# Patient Record
Sex: Male | Born: 1970 | Race: White | Hispanic: No | Marital: Married | Smoking: Current some day smoker
Health system: Southern US, Community
[De-identification: ages and names within clinical notes are randomized; demographics above are authoritative.]

## PROBLEM LIST (undated history)

## (undated) DIAGNOSIS — Z9889 Other specified postprocedural states: Secondary | ICD-10-CM

## (undated) DIAGNOSIS — Z87442 Personal history of urinary calculi: Secondary | ICD-10-CM

## (undated) DIAGNOSIS — T8859XA Other complications of anesthesia, initial encounter: Secondary | ICD-10-CM

## (undated) DIAGNOSIS — F419 Anxiety disorder, unspecified: Secondary | ICD-10-CM

## (undated) DIAGNOSIS — E119 Type 2 diabetes mellitus without complications: Secondary | ICD-10-CM

## (undated) DIAGNOSIS — T7840XA Allergy, unspecified, initial encounter: Secondary | ICD-10-CM

## (undated) DIAGNOSIS — R51 Headache: Secondary | ICD-10-CM

## (undated) DIAGNOSIS — I10 Essential (primary) hypertension: Secondary | ICD-10-CM

## (undated) HISTORY — DX: Allergy, unspecified, initial encounter: T78.40XA

## (undated) HISTORY — DX: Headache: R51

## (undated) HISTORY — PX: OTHER SURGICAL HISTORY: SHX169

## (undated) HISTORY — PX: KNEE ARTHROSCOPY: SUR90

## (undated) HISTORY — DX: Anxiety disorder, unspecified: F41.9

## (undated) HISTORY — DX: Essential (primary) hypertension: I10

---

## 2000-11-19 ENCOUNTER — Encounter: Admission: RE | Admit: 2000-11-19 | Discharge: 2000-11-19 | Payer: Self-pay | Admitting: Family Medicine

## 2001-03-26 ENCOUNTER — Encounter: Admission: RE | Admit: 2001-03-26 | Discharge: 2001-03-26 | Payer: Self-pay | Admitting: Family Medicine

## 2001-10-21 ENCOUNTER — Encounter: Admission: RE | Admit: 2001-10-21 | Discharge: 2001-10-21 | Payer: Self-pay | Admitting: Family Medicine

## 2001-11-09 ENCOUNTER — Encounter: Admission: RE | Admit: 2001-11-09 | Discharge: 2001-11-09 | Payer: Self-pay | Admitting: Family Medicine

## 2002-01-13 ENCOUNTER — Encounter: Payer: Self-pay | Admitting: Family Medicine

## 2002-01-13 ENCOUNTER — Encounter: Admission: RE | Admit: 2002-01-13 | Discharge: 2002-01-13 | Payer: Self-pay | Admitting: Family Medicine

## 2002-01-25 ENCOUNTER — Encounter: Admission: RE | Admit: 2002-01-25 | Discharge: 2002-01-25 | Payer: Self-pay | Admitting: General Practice

## 2002-01-25 ENCOUNTER — Encounter: Payer: Self-pay | Admitting: General Practice

## 2004-10-25 ENCOUNTER — Ambulatory Visit: Payer: Self-pay | Admitting: Family Medicine

## 2004-12-06 ENCOUNTER — Ambulatory Visit: Payer: Self-pay | Admitting: Family Medicine

## 2005-09-02 ENCOUNTER — Ambulatory Visit: Payer: Self-pay | Admitting: Family Medicine

## 2006-02-16 ENCOUNTER — Ambulatory Visit: Payer: Self-pay | Admitting: Family Medicine

## 2006-04-04 ENCOUNTER — Ambulatory Visit: Payer: Self-pay | Admitting: Family Medicine

## 2006-08-03 ENCOUNTER — Ambulatory Visit: Payer: Self-pay | Admitting: Family Medicine

## 2006-08-14 ENCOUNTER — Ambulatory Visit: Payer: Self-pay | Admitting: Family Medicine

## 2006-10-21 ENCOUNTER — Ambulatory Visit: Payer: Self-pay | Admitting: Family Medicine

## 2007-01-04 ENCOUNTER — Ambulatory Visit: Payer: Self-pay | Admitting: Family Medicine

## 2007-02-01 ENCOUNTER — Ambulatory Visit: Payer: Self-pay | Admitting: Family Medicine

## 2007-09-30 ENCOUNTER — Encounter: Payer: Self-pay | Admitting: Family Medicine

## 2007-10-04 ENCOUNTER — Ambulatory Visit: Payer: Self-pay | Admitting: Family Medicine

## 2007-10-04 DIAGNOSIS — J019 Acute sinusitis, unspecified: Secondary | ICD-10-CM | POA: Insufficient documentation

## 2007-10-12 ENCOUNTER — Ambulatory Visit: Payer: Self-pay | Admitting: Family Medicine

## 2007-10-12 DIAGNOSIS — B9789 Other viral agents as the cause of diseases classified elsewhere: Secondary | ICD-10-CM

## 2007-10-14 ENCOUNTER — Ambulatory Visit: Payer: Self-pay | Admitting: Family Medicine

## 2007-10-14 DIAGNOSIS — R51 Headache: Secondary | ICD-10-CM | POA: Insufficient documentation

## 2007-10-14 DIAGNOSIS — J309 Allergic rhinitis, unspecified: Secondary | ICD-10-CM | POA: Insufficient documentation

## 2007-10-14 DIAGNOSIS — I10 Essential (primary) hypertension: Secondary | ICD-10-CM | POA: Insufficient documentation

## 2007-10-14 DIAGNOSIS — F411 Generalized anxiety disorder: Secondary | ICD-10-CM

## 2007-10-14 DIAGNOSIS — F419 Anxiety disorder, unspecified: Secondary | ICD-10-CM | POA: Insufficient documentation

## 2007-10-14 DIAGNOSIS — R519 Headache, unspecified: Secondary | ICD-10-CM | POA: Insufficient documentation

## 2007-10-14 LAB — CONVERTED CEMR LAB
ALT: 39 units/L (ref 0–53)
AST: 31 units/L (ref 0–37)
Albumin: 3.9 g/dL (ref 3.5–5.2)
Alkaline Phosphatase: 49 units/L (ref 39–117)
BUN: 11 mg/dL (ref 6–23)
Basophils Absolute: 0.1 10*3/uL (ref 0.0–0.1)
Basophils Relative: 0.8 % (ref 0.0–1.0)
Bilirubin, Direct: 0.1 mg/dL (ref 0.0–0.3)
CO2: 29 meq/L (ref 19–32)
Calcium: 9.2 mg/dL (ref 8.4–10.5)
Chloride: 105 meq/L (ref 96–112)
Creatinine, Ser: 1.1 mg/dL (ref 0.4–1.5)
Eosinophils Absolute: 0.2 10*3/uL (ref 0.0–0.6)
Eosinophils Relative: 2.6 % (ref 0.0–5.0)
GFR calc Af Amer: 99 mL/min
GFR calc non Af Amer: 81 mL/min
Glucose, Bld: 153 mg/dL — ABNORMAL HIGH (ref 70–99)
HCT: 42.5 % (ref 39.0–52.0)
Hemoglobin: 14.5 g/dL (ref 13.0–17.0)
Lymphocytes Relative: 13.7 % (ref 12.0–46.0)
MCHC: 34 g/dL (ref 30.0–36.0)
MCV: 88.7 fL (ref 78.0–100.0)
Mono Screen: NEGATIVE
Monocytes Absolute: 1 10*3/uL — ABNORMAL HIGH (ref 0.2–0.7)
Monocytes Relative: 12.3 % — ABNORMAL HIGH (ref 3.0–11.0)
Neutro Abs: 5.5 10*3/uL (ref 1.4–7.7)
Neutrophils Relative %: 70.6 % (ref 43.0–77.0)
Platelets: 262 10*3/uL (ref 150–400)
Potassium: 3.8 meq/L (ref 3.5–5.1)
RBC: 4.79 M/uL (ref 4.22–5.81)
RDW: 11.7 % (ref 11.5–14.6)
Sodium: 140 meq/L (ref 135–145)
TSH: 1.68 microintl units/mL (ref 0.35–5.50)
Total Bilirubin: 0.7 mg/dL (ref 0.3–1.2)
Total Protein: 6.7 g/dL (ref 6.0–8.3)
WBC: 7.9 10*3/uL (ref 4.5–10.5)

## 2007-11-03 ENCOUNTER — Ambulatory Visit: Payer: Self-pay | Admitting: Family Medicine

## 2007-11-05 LAB — CONVERTED CEMR LAB: Hgb A1c MFr Bld: 5.6 % (ref 4.6–6.0)

## 2007-11-10 ENCOUNTER — Ambulatory Visit: Payer: Self-pay | Admitting: Family Medicine

## 2008-01-04 ENCOUNTER — Ambulatory Visit: Payer: Self-pay | Admitting: Family Medicine

## 2008-01-04 LAB — CONVERTED CEMR LAB
Bilirubin Urine: NEGATIVE
Blood in Urine, dipstick: NEGATIVE
Glucose, Urine, Semiquant: NEGATIVE
Ketones, urine, test strip: NEGATIVE
Nitrite: NEGATIVE
Specific Gravity, Urine: 1.025
Urobilinogen, UA: 0.2
WBC Urine, dipstick: NEGATIVE
pH: 6.5

## 2008-01-06 LAB — CONVERTED CEMR LAB
ALT: 34 units/L (ref 0–53)
AST: 38 units/L — ABNORMAL HIGH (ref 0–37)
Albumin: 4.3 g/dL (ref 3.5–5.2)
Alkaline Phosphatase: 40 units/L (ref 39–117)
BUN: 18 mg/dL (ref 6–23)
Basophils Absolute: 0 10*3/uL (ref 0.0–0.1)
Basophils Relative: 0.1 % (ref 0.0–1.0)
Bilirubin, Direct: 0.2 mg/dL (ref 0.0–0.3)
CO2: 31 meq/L (ref 19–32)
Calcium: 9.8 mg/dL (ref 8.4–10.5)
Chloride: 101 meq/L (ref 96–112)
Cholesterol: 180 mg/dL (ref 0–200)
Creatinine, Ser: 1.2 mg/dL (ref 0.4–1.5)
Eosinophils Absolute: 0.2 10*3/uL (ref 0.0–0.6)
Eosinophils Relative: 2.6 % (ref 0.0–5.0)
GFR calc Af Amer: 89 mL/min
GFR calc non Af Amer: 74 mL/min
Glucose, Bld: 95 mg/dL (ref 70–99)
HCT: 47.7 % (ref 39.0–52.0)
HDL: 30.3 mg/dL — ABNORMAL LOW (ref >39.0)
Hemoglobin: 15.8 g/dL (ref 13.0–17.0)
LDL Cholesterol: 125 mg/dL — ABNORMAL HIGH (ref 0–99)
Lymphocytes Relative: 31.2 % (ref 12.0–46.0)
MCHC: 33.2 g/dL (ref 30.0–36.0)
MCV: 88.5 fL (ref 78.0–100.0)
Monocytes Absolute: 1.1 10*3/uL — ABNORMAL HIGH (ref 0.2–0.7)
Monocytes Relative: 15.6 % — ABNORMAL HIGH (ref 3.0–11.0)
Neutro Abs: 3.4 10*3/uL (ref 1.4–7.7)
Neutrophils Relative %: 50.5 % (ref 43.0–77.0)
Platelets: 257 10*3/uL (ref 150–400)
Potassium: 4.2 meq/L (ref 3.5–5.1)
RBC: 5.39 M/uL (ref 4.22–5.81)
RDW: 13.2 % (ref 11.5–14.6)
Sodium: 139 meq/L (ref 135–145)
TSH: 1.9 microintl units/mL (ref 0.35–5.50)
Total Bilirubin: 1 mg/dL (ref 0.3–1.2)
Total CHOL/HDL Ratio: 5.9
Total Protein: 7.1 g/dL (ref 6.0–8.3)
Triglycerides: 122 mg/dL (ref 0–149)
VLDL: 24 mg/dL (ref 0–40)
WBC: 6.9 10*3/uL (ref 4.5–10.5)

## 2008-02-09 ENCOUNTER — Ambulatory Visit: Payer: Self-pay | Admitting: Family Medicine

## 2008-05-12 ENCOUNTER — Telehealth: Payer: Self-pay | Admitting: Family Medicine

## 2008-05-19 ENCOUNTER — Ambulatory Visit: Payer: Self-pay | Admitting: Family Medicine

## 2008-12-04 ENCOUNTER — Ambulatory Visit: Payer: Self-pay | Admitting: Family Medicine

## 2008-12-04 DIAGNOSIS — K589 Irritable bowel syndrome without diarrhea: Secondary | ICD-10-CM | POA: Insufficient documentation

## 2008-12-14 ENCOUNTER — Ambulatory Visit: Payer: Self-pay | Admitting: Family Medicine

## 2008-12-18 ENCOUNTER — Telehealth: Payer: Self-pay | Admitting: Family Medicine

## 2009-02-07 ENCOUNTER — Ambulatory Visit: Payer: Self-pay | Admitting: Family Medicine

## 2009-02-07 DIAGNOSIS — M6740 Ganglion, unspecified site: Secondary | ICD-10-CM | POA: Insufficient documentation

## 2009-02-13 ENCOUNTER — Telehealth: Payer: Self-pay | Admitting: Family Medicine

## 2009-03-02 ENCOUNTER — Ambulatory Visit: Payer: Self-pay | Admitting: Family Medicine

## 2009-03-02 LAB — CONVERTED CEMR LAB
ALT: 40 units/L (ref 0–53)
AST: 34 units/L (ref 0–37)
Albumin: 4.2 g/dL (ref 3.5–5.2)
Alkaline Phosphatase: 50 units/L (ref 39–117)
BUN: 15 mg/dL (ref 6–23)
Basophils Absolute: 0 10*3/uL (ref 0.0–0.1)
Basophils Relative: 0.4 % (ref 0.0–3.0)
Bilirubin Urine: NEGATIVE
Bilirubin, Direct: 0.1 mg/dL (ref 0.0–0.3)
CO2: 31 meq/L (ref 19–32)
Calcium: 9.4 mg/dL (ref 8.4–10.5)
Chloride: 104 meq/L (ref 96–112)
Cholesterol: 188 mg/dL (ref 0–200)
Creatinine, Ser: 1.2 mg/dL (ref 0.4–1.5)
Eosinophils Absolute: 0.2 10*3/uL (ref 0.0–0.7)
Eosinophils Relative: 2.9 % (ref 0.0–5.0)
GFR calc non Af Amer: 72.76 mL/min (ref >60)
Glucose, Bld: 104 mg/dL — ABNORMAL HIGH (ref 70–99)
HCT: 45.4 % (ref 39.0–52.0)
HDL: 34.4 mg/dL — ABNORMAL LOW (ref >39.00)
Hemoglobin, Urine: NEGATIVE
Hemoglobin: 16.1 g/dL (ref 13.0–17.0)
Ketones, ur: NEGATIVE mg/dL
LDL Cholesterol: 132 mg/dL — ABNORMAL HIGH (ref 0–99)
Leukocytes, UA: NEGATIVE
Lymphocytes Relative: 29 % (ref 12.0–46.0)
Lymphs Abs: 1.9 10*3/uL (ref 0.7–4.0)
MCHC: 35.5 g/dL (ref 30.0–36.0)
MCV: 88.6 fL (ref 78.0–100.0)
Monocytes Absolute: 0.7 10*3/uL (ref 0.1–1.0)
Monocytes Relative: 10.8 % (ref 3.0–12.0)
Neutro Abs: 3.7 10*3/uL (ref 1.4–7.7)
Neutrophils Relative %: 56.9 % (ref 43.0–77.0)
Nitrite: NEGATIVE
Platelets: 251 10*3/uL (ref 150.0–400.0)
Potassium: 4.1 meq/L (ref 3.5–5.1)
RBC: 5.12 M/uL (ref 4.22–5.81)
RDW: 12 % (ref 11.5–14.6)
Sodium: 141 meq/L (ref 135–145)
Specific Gravity, Urine: 1.02 (ref 1.000–1.030)
TSH: 2.28 microintl units/mL (ref 0.35–5.50)
Total Bilirubin: 0.8 mg/dL (ref 0.3–1.2)
Total CHOL/HDL Ratio: 5
Total Protein, Urine: NEGATIVE mg/dL
Total Protein: 7.2 g/dL (ref 6.0–8.3)
Triglycerides: 108 mg/dL (ref 0.0–149.0)
Urine Glucose: NEGATIVE mg/dL
Urobilinogen, UA: 0.2 (ref 0.0–1.0)
VLDL: 21.6 mg/dL (ref 0.0–40.0)
WBC: 6.5 10*3/uL (ref 4.5–10.5)
pH: 6.5 (ref 5.0–8.0)

## 2009-03-07 ENCOUNTER — Ambulatory Visit: Payer: Self-pay | Admitting: Family Medicine

## 2009-05-22 ENCOUNTER — Ambulatory Visit: Payer: Self-pay | Admitting: Family Medicine

## 2009-06-15 ENCOUNTER — Telehealth: Payer: Self-pay | Admitting: Family Medicine

## 2009-09-24 ENCOUNTER — Ambulatory Visit: Payer: Self-pay | Admitting: Family Medicine

## 2009-09-28 ENCOUNTER — Telehealth: Payer: Self-pay | Admitting: Family Medicine

## 2009-12-03 ENCOUNTER — Telehealth: Payer: Self-pay | Admitting: Family Medicine

## 2010-03-12 ENCOUNTER — Ambulatory Visit: Payer: Self-pay | Admitting: Family Medicine

## 2010-03-12 DIAGNOSIS — M7710 Lateral epicondylitis, unspecified elbow: Secondary | ICD-10-CM | POA: Insufficient documentation

## 2010-04-19 ENCOUNTER — Ambulatory Visit: Payer: Self-pay | Admitting: Family Medicine

## 2010-04-25 ENCOUNTER — Ambulatory Visit: Payer: Self-pay | Admitting: Family Medicine

## 2010-04-29 LAB — CONVERTED CEMR LAB
ALT: 32 units/L (ref 0–53)
AST: 26 units/L (ref 0–37)
Albumin: 4.5 g/dL (ref 3.5–5.2)
Alkaline Phosphatase: 53 units/L (ref 39–117)
BUN: 14 mg/dL (ref 6–23)
Basophils Absolute: 0.1 10*3/uL (ref 0.0–0.1)
Basophils Relative: 0.7 % (ref 0.0–3.0)
Bilirubin Urine: NEGATIVE
Bilirubin, Direct: 0.1 mg/dL (ref 0.0–0.3)
CO2: 32 meq/L (ref 19–32)
Calcium: 9.8 mg/dL (ref 8.4–10.5)
Chloride: 102 meq/L (ref 96–112)
Cholesterol: 173 mg/dL (ref 0–200)
Creatinine, Ser: 1.2 mg/dL (ref 0.4–1.5)
Direct LDL: 106.6 mg/dL
Eosinophils Absolute: 0.2 10*3/uL (ref 0.0–0.7)
Eosinophils Relative: 2.7 % (ref 0.0–5.0)
GFR calc non Af Amer: 73.72 mL/min (ref >60)
Glucose, Bld: 82 mg/dL (ref 70–99)
HCT: 47.2 % (ref 39.0–52.0)
HDL: 37.1 mg/dL — ABNORMAL LOW (ref >39.00)
Hemoglobin, Urine: NEGATIVE
Hemoglobin: 16.5 g/dL (ref 13.0–17.0)
Ketones, ur: NEGATIVE mg/dL
Leukocytes, UA: NEGATIVE
Lymphocytes Relative: 25.6 % (ref 12.0–46.0)
Lymphs Abs: 2 10*3/uL (ref 0.7–4.0)
MCHC: 35 g/dL (ref 30.0–36.0)
MCV: 88.3 fL (ref 78.0–100.0)
Monocytes Absolute: 0.9 10*3/uL (ref 0.1–1.0)
Monocytes Relative: 10.7 % (ref 3.0–12.0)
Neutro Abs: 4.8 10*3/uL (ref 1.4–7.7)
Neutrophils Relative %: 60.3 % (ref 43.0–77.0)
Nitrite: NEGATIVE
Platelets: 306 10*3/uL (ref 150.0–400.0)
Potassium: 4.6 meq/L (ref 3.5–5.1)
RBC: 5.35 M/uL (ref 4.22–5.81)
RDW: 12.6 % (ref 11.5–14.6)
Sodium: 139 meq/L (ref 135–145)
Specific Gravity, Urine: 1.01 (ref 1.000–1.030)
TSH: 2.1 microintl units/mL (ref 0.35–5.50)
Total Bilirubin: 0.6 mg/dL (ref 0.3–1.2)
Total CHOL/HDL Ratio: 5
Total Protein, Urine: NEGATIVE mg/dL
Total Protein: 7.2 g/dL (ref 6.0–8.3)
Triglycerides: 228 mg/dL — ABNORMAL HIGH (ref 0.0–149.0)
Urine Glucose: NEGATIVE mg/dL
Urobilinogen, UA: 0.2 (ref 0.0–1.0)
VLDL: 45.6 mg/dL — ABNORMAL HIGH (ref 0.0–40.0)
WBC: 8 10*3/uL (ref 4.5–10.5)
pH: 7 (ref 5.0–8.0)

## 2010-04-30 ENCOUNTER — Ambulatory Visit: Payer: Self-pay | Admitting: Family Medicine

## 2010-10-07 ENCOUNTER — Telehealth: Payer: Self-pay | Admitting: Family Medicine

## 2011-01-07 NOTE — Assessment & Plan Note (Signed)
Summary: sinuses//ccm   Vital Signs:  Patient profile:   40 year old male Weight:      203 pounds Temp:     98.2 degrees F oral Pulse rate:   90 / minute Pulse rhythm:   regular BP sitting:   136 / 98  (left arm) Cuff size:   regular  Vitals Entered By: Duard Brady LPN (March 12, 1609 3:28 PM) CC: c/o sinus infection - started prednisone friday - stopped it today Is Patient Diabetic? No   History of Present Illness: Here for one week of sinus pain and pressure, PND, ST, and a dry cough. No fever. He took Prednisone for 3 days with no improvement. He also mentions some intermittent pains in the right elbow which started a few weeks ago. No trauma he knows of, but of course he plays a lot of tennis and works a s a Careers information officer.   Preventive Screening-Counseling & Management  Alcohol-Tobacco     Smoking Status: never  Allergies (verified): No Known Drug Allergies  Past History:  Past Medical History: Reviewed history from 10/14/2007 and no changes required. Allergic rhinitis Anxiety Headache Hypertension  Review of Systems  The patient denies anorexia, fever, weight loss, weight gain, vision loss, decreased hearing, hoarseness, chest pain, syncope, dyspnea on exertion, peripheral edema, hemoptysis, abdominal pain, melena, hematochezia, severe indigestion/heartburn, hematuria, incontinence, genital sores, muscle weakness, suspicious skin lesions, transient blindness, difficulty walking, depression, unusual weight change, abnormal bleeding, enlarged lymph nodes, angioedema, breast masses, and testicular masses.    Physical Exam  General:  Well-developed,well-nourished,in no acute distress; alert,appropriate and cooperative throughout examination Head:  Normocephalic and atraumatic without obvious abnormalities. No apparent alopecia or balding. Eyes:  No corneal or conjunctival inflammation noted. EOMI. Perrla. Funduscopic exam benign, without hemorrhages, exudates  or papilledema. Vision grossly normal. Ears:  External ear exam shows no significant lesions or deformities.  Otoscopic examination reveals clear canals, tympanic membranes are intact bilaterally without bulging, retraction, inflammation or discharge. Hearing is grossly normal bilaterally. Nose:  External nasal examination shows no deformity or inflammation. Nasal mucosa are pink and moist without lesions or exudates. Mouth:  Oral mucosa and oropharynx without lesions or exudates.  Teeth in good repair. Neck:  No deformities, masses, or tenderness noted. Lungs:  Normal respiratory effort, chest expands symmetrically. Lungs are clear to auscultation, no crackles or wheezes. Extremities:  tender over the right lateral epicondyle, full ROM   Impression & Recommendations:  Problem # 1:  SINUSITIS, ACUTE NOS (ICD-461.9)  His updated medication list for this problem includes:    Zyrtec-d Allergy & Congestion 5-120 Mg Tb12 (Cetirizine-pseudoephedrine) ..... Once daily    Flonase 50 Mcg/act Susp (Fluticasone propionate) .Marland Kitchen... 2 sprays each nostril once daily    Avelox 400 Mg Tabs (Moxifloxacin hcl) ..... Once daily  Problem # 2:  LATERAL EPICONDYLITIS (ICD-726.32)  Complete Medication List: 1)  Zyrtec-d Allergy & Congestion 5-120 Mg Tb12 (Cetirizine-pseudoephedrine) .... Once daily 2)  Multivitamins Tabs (Multiple vitamin) .... Once daily 3)  Prednisone 10 Mg Tabs (Prednisone) .... As directed 4)  Exforge 10-320 Mg Tabs (Amlodipine besylate-valsartan) .Marland Kitchen.. 1 by mouth once daily 5)  Flonase 50 Mcg/act Susp (Fluticasone propionate) .... 2 sprays each nostril once daily 6)  Avelox 400 Mg Tabs (Moxifloxacin hcl) .... Once daily  Patient Instructions: 1)  rest the arm, use a support strap, ice packs, Motrin as needed . 2)  Please schedule a follow-up appointment as needed .  Prescriptions: AVELOX 400 MG TABS (MOXIFLOXACIN  HCL) once daily  #10 x 0   Entered and Authorized by:   Nelwyn Salisbury MD    Signed by:   Nelwyn Salisbury MD on 03/12/2010   Method used:   Electronically to        [facility] (retail)       [address]       [place], [place]  [address]       Ph: [phone] or [phone]       Fax: [phone]   RxID:   [id number]

## 2011-01-07 NOTE — Progress Notes (Signed)
Summary: zoloft refill & increased strength  Phone Note Call from Patient Call back at Home Phone [phone]   Caller: vm Summary of Call: Needs refill Zoloft 50mg , but requests 100mg  strength.  Took some of wife's 100mg & that is better for him.  Please give 26mo of the 100mg .   HT [facility] [facility]    Initial call taken by: Rudy Jew, RN,  October 07, 2010 9:06 AM  Follow-up for Phone Call        increase Zoloft to 100 mg once daily . Call in 6  month suply Follow-up by: Nelwyn Salisbury MD,  October 07, 2010 11:32 AM  Additional Follow-up for Phone Call Additional follow up Details #1::        done  pt notified.  Additional Follow-up by: Pura Spice, RN,  October 07, 2010 11:40 AM    New/Updated Medications: ZOLOFT 100 MG TABS (SERTRALINE HCL) 1 by mouth once daily Prescriptions: ZOLOFT 100 MG TABS (SERTRALINE HCL) 1 by mouth once daily  #30 x 6   Entered by:   Pura Spice, RN   Authorized by:   Nelwyn Salisbury MD   Signed by:   Pura Spice, RN on 10/07/2010   Method used:   Electronically to        [facility] (retail)       [address]       [place], [place]  [address]       Ph: [phone] or [phone]       Fax: [phone]   RxID:   [id number]

## 2011-01-07 NOTE — Assessment & Plan Note (Signed)
Summary: cpx/njr   Vital Signs:  Patient profile:   40 year old male Weight:      196 pounds BMI:     29.05 BP sitting:   166 / 86  (left arm) Cuff size:   regular  Vitals Entered By: Raechel Ache, RN (Apr 30, 2010 10:09 AM) CC: CPX, labs done.   History of Present Illness: 40 yr old male for a cpx. He feels fine physically, and emotionally he feels much better. He has been taking Zoloft for the past 2 weeks, and he feels more relaxed and more in control of his emotions. No problems to report.   Allergies (verified): No Known Drug Allergies  Past History:  Past Medical History: Reviewed history from 10/14/2007 and no changes required. Allergic rhinitis Anxiety Headache Hypertension  Past Surgical History: Reviewed history from 10/14/2007 and no changes required. wisdom teeth extraction right knee arthroscopy  Family History: Reviewed history from 10/14/2007 and no changes required. Family History Diabetes 1st degree relative Family History High cholesterol Family History Hypertension Family History of Alcoholism/Addiction Family History Depression  Social History: Reviewed history from 10/14/2007 and no changes required. Married Never Smoked Alcohol use-yes  Review of Systems  The patient denies anorexia, fever, weight loss, weight gain, vision loss, decreased hearing, hoarseness, chest pain, syncope, dyspnea on exertion, peripheral edema, prolonged cough, headaches, hemoptysis, abdominal pain, melena, hematochezia, severe indigestion/heartburn, hematuria, incontinence, genital sores, muscle weakness, suspicious skin lesions, transient blindness, difficulty walking, depression, unusual weight change, abnormal bleeding, enlarged lymph nodes, angioedema, breast masses, and testicular masses.    Physical Exam  General:  Well-developed,well-nourished,in no acute distress; alert,appropriate and cooperative throughout examination Head:  Normocephalic and atraumatic  without obvious abnormalities. No apparent alopecia or balding. Eyes:  No corneal or conjunctival inflammation noted. EOMI. Perrla. Funduscopic exam benign, without hemorrhages, exudates or papilledema. Vision grossly normal. Ears:  External ear exam shows no significant lesions or deformities.  Otoscopic examination reveals clear canals, tympanic membranes are intact bilaterally without bulging, retraction, inflammation or discharge. Hearing is grossly normal bilaterally. Nose:  External nasal examination shows no deformity or inflammation. Nasal mucosa are pink and moist without lesions or exudates. Mouth:  Oral mucosa and oropharynx without lesions or exudates.  Teeth in good repair. Neck:  No deformities, masses, or tenderness noted. Chest Wall:  No deformities, masses, tenderness or gynecomastia noted. Lungs:  Normal respiratory effort, chest expands symmetrically. Lungs are clear to auscultation, no crackles or wheezes. Heart:  Normal rate and regular rhythm. S1 and S2 normal without gallop, murmur, click, rub or other extra sounds. Abdomen:  Bowel sounds positive,abdomen soft and non-tender without masses, organomegaly or hernias noted. Genitalia:  Testes bilaterally descended without nodularity, tenderness or masses. No scrotal masses or lesions. No penis lesions or urethral discharge. Msk:  No deformity or scoliosis noted of thoracic or lumbar spine.   Pulses:  R and L carotid,radial,femoral,dorsalis pedis and posterior tibial pulses are full and equal bilaterally Extremities:  No clubbing, cyanosis, edema, or deformity noted with normal full range of motion of all joints.   Neurologic:  No cranial nerve deficits noted. Station and gait are normal. Plantar reflexes are down-going bilaterally. DTRs are symmetrical throughout. Sensory, motor and coordinative functions appear intact. Skin:  Intact without suspicious lesions or rashes Cervical Nodes:  No lymphadenopathy noted Axillary Nodes:   No palpable lymphadenopathy Inguinal Nodes:  No significant adenopathy Psych:  Cognition and judgment appear intact. Alert and cooperative with normal attention span and concentration. No  apparent delusions, illusions, hallucinations   Impression & Recommendations:  Problem # 1:  PHYSICAL EXAMINATION (ICD-V70.0)  Complete Medication List: 1)  Zyrtec-d Allergy & Congestion 5-120 Mg Tb12 (Cetirizine-pseudoephedrine) .... Once daily 2)  Exforge 10-320 Mg Tabs (Amlodipine besylate-valsartan) .Marland Kitchen.. 1 by mouth once daily 3)  Flonase 50 Mcg/act Susp (Fluticasone propionate) .... 2 sprays each nostril once daily 4)  Zoloft 50 Mg Tabs (Sertraline hcl) .... Once daily  Patient Instructions: 1)  Please schedule a follow-up appointment in 3 months .

## 2011-01-07 NOTE — Assessment & Plan Note (Signed)
Summary: ANXIETY CONCERNS / STRESS // RS   Vital Signs:  Patient profile:   40 year old male Weight:      196 pounds Temp:     97.6 degrees F oral Pulse rate:   104 / minute Resp:     12 per minute BP sitting:   150 / 96  Vitals Entered By: Darren Calhoun CMA (Apr 19, 2010 9:42 AM) CC: anxiety issues Is Patient Diabetic? No Pain Assessment Patient in pain? no        History of Present Illness: Here asking for help with anxiety. He and his wife have been having marital problems, and they are now seriously thinking about separating. They still get along fairly well, but they are not sure they want to stay married. This is very upsetting to Darren Calhoun, and he has trouble relaxing, esting ,sleeping, etc.   Current Medications (verified): 1)  Zyrtec-D Allergy & Congestion 5-120 Mg  Tb12 (Cetirizine-Pseudoephedrine) .... Once Daily 2)  Exforge 10-320 Mg Tabs (Amlodipine Besylate-Valsartan) .Marland Kitchen.. 1 By Mouth Once Daily 3)  Flonase 50 Mcg/act Susp (Fluticasone Propionate) .... 2 Sprays Each Nostril Once Daily  Allergies (verified): No Known Drug Allergies  Past History:  Past Medical History: Reviewed history from 10/14/2007 and no changes required. Allergic rhinitis Anxiety Headache Hypertension  Review of Systems  The patient denies anorexia, fever, weight loss, weight gain, vision loss, decreased hearing, hoarseness, chest pain, syncope, dyspnea on exertion, peripheral edema, prolonged cough, headaches, hemoptysis, abdominal pain, melena, hematochezia, severe indigestion/heartburn, hematuria, incontinence, genital sores, muscle weakness, suspicious skin lesions, transient blindness, difficulty walking, unusual weight change, abnormal bleeding, enlarged lymph nodes, angioedema, breast masses, and testicular masses.    Physical Exam  General:  Well-developed,well-nourished,in no acute distress; alert,appropriate and cooperative throughout examination Psych:  Oriented X3, memory  intact for recent and remote, normally interactive, good eye contact, and slightly anxious.     Impression & Recommendations:  Problem # 1:  ANXIETY (ICD-300.00)  His updated medication list for this problem includes:    Zoloft 50 Mg Tabs (Sertraline hcl) ..... Once daily  Complete Medication List: 1)  Zyrtec-d Allergy & Congestion 5-120 Mg Tb12 (Cetirizine-pseudoephedrine) .... Once daily 2)  Exforge 10-320 Mg Tabs (Amlodipine besylate-valsartan) .Marland Kitchen.. 1 by mouth once daily 3)  Flonase 50 Mcg/act Susp (Fluticasone propionate) .... 2 sprays each nostril once daily 4)  Zoloft 50 Mg Tabs (Sertraline hcl) .... Once daily  Patient Instructions: 1)  We discussed this for 30 minutes and agreed to try medication. start Zoloft.  2)  Please schedule a follow-up appointment in 2 weeks.  Prescriptions: ZOLOFT 50 MG TABS (SERTRALINE HCL) once daily  #30 x 5   Entered and Authorized by:   Nelwyn Salisbury MD   Signed by:   Nelwyn Salisbury MD on 04/19/2010   Method used:   Electronically to        [facility] (retail)       [address]       [place], [place]  [address]       Ph: [phone] or [phone]       Fax: [phone]   RxID:   [id number]

## 2011-02-16 ENCOUNTER — Other Ambulatory Visit: Payer: Self-pay | Admitting: Family Medicine

## 2011-02-18 ENCOUNTER — Other Ambulatory Visit: Payer: Self-pay | Admitting: Family Medicine

## 2011-04-15 ENCOUNTER — Other Ambulatory Visit: Payer: Self-pay | Admitting: Family Medicine

## 2011-04-22 NOTE — Letter (Signed)
October 14, 2007     RE:  SARGENT, MANKEY  MRN:  [id number]  /  DOB:  01-Dec-1971   To Whom It May Concern:   This letter is concerning a patient of mine by the name of Darren Calhoun (date of birth [birth year]).  I have been serving as this  patient's primary care physician since I met him in 2003.  He has a long  history of upper respiratory allergies among other things.  Over the  last several years, he has had a lot more trouble with these allergies  than he ever has, and in fact, has developed frequent upper respiratory  infections and sinus infections, which are directly related to flare ups  of his allergies.  I have treated him with numerous courses of  antibiotics, and apparently he has been out of work frequently due to a  physical inability to perform his job.  He informs me that his work  environment includes working in older buildings, which contain a lot of  fungal elements, mildew, and dust.  I am quite certain that these  greatly exacerbate his allergy symptoms, and also play a role in his  frequent sinus infections.  I believe he is currently attempting to move  out of this work environment in to newer and cleaner facilities.  This  letter will serve as my medical recommendation that he do this as  quickly as possible.  I do believe his current work environment  adversely affects his health.   If I may be of further assistance, please let me know.    Sincerely,      Tera Mater. Clent Ridges, MD  Electronically Signed    SAF/MedQ  DD: 10/14/2007  DT: 10/14/2007  Job #: [id number]

## 2011-04-25 NOTE — Assessment & Plan Note (Signed)
[facility] OFFICE NOTE   Calhoun, Darren                     MRN:          [id number]  DATE:08/14/2006                            DOB:          [birth year]    This is a 40 year old gentleman here for complete physical examination.  Generally, he feels good and has no complaints.  He does need refills on his  allergy medications, however, he continues to be quite active with playing a  lot of tennis.  We have been treating him also for hyperlipidemia and  hypertension.  For details of his past medical history, family history,  social history, habits, etc, refer to our last physical note dated December 12, 2003.   ALLERGIES:  None.   CURRENT MEDICATIONS:  1. Diovan HCT 160/25, once a day.  2. Zyrtec 10 mg per day.  3. Rhinocort Aqua sprays daily as needed.   OBJECTIVE:  VITAL SIGNS:  Height 5 feet, 10 inches.  Weight 193.  BP 140/100  (at home he is consistently in the 120s systolic and 70s diastolic).  Pulsating regular.  GENERAL:  He appears to be healthy.  SKIN:  Free of any lesions.  EYES:  Clear.  EARS:  Clear.  PHARYNX:  Clear.  NECK:  Supple without lymphadenopathy or masses.  LUNGS:  Clear.  CARDIAC:  Regular rate and rhythm regular without gallops, murmurs, or rubs.  Distal pulses full.  ABDOMEN:  Soft, normal bowel sounds, nontender.  No masses.  GENITALIA:  Normal male.  EXTREMITIES:  No clubbing, cyanosis, or edema.  NEUROLOGIC:  Grossly intact.   He was here for fasting labs on August 27.  These were remarkable only for a  mildly abnormal lipid panel.  His LDL was slightly high at 127.  HDL  slightly low at 36.   ASSESSMENT AND PLAN:  1. Complete physical examination.  We will get this again in a year.  2. Hyperlipidemia.  Advised him to watch his diet a little more strictly.  3. Hypertension, probably stable.  4. Allergies, stable.                                   Tera Mater. Clent Ridges, MD   SAF/MedQ  DD:  08/17/2006  DT:  08/17/2006  Job #:  [id number]

## 2011-06-22 ENCOUNTER — Other Ambulatory Visit: Payer: Self-pay | Admitting: Family Medicine

## 2011-09-21 ENCOUNTER — Other Ambulatory Visit: Payer: Self-pay | Admitting: Family Medicine

## 2011-10-23 ENCOUNTER — Other Ambulatory Visit: Payer: Self-pay | Admitting: Family Medicine

## 2011-11-23 ENCOUNTER — Other Ambulatory Visit: Payer: Self-pay | Admitting: Family Medicine

## 2011-12-18 ENCOUNTER — Other Ambulatory Visit: Payer: Self-pay | Admitting: Family Medicine

## 2011-12-22 ENCOUNTER — Telehealth: Payer: Self-pay | Admitting: Family Medicine

## 2011-12-22 MED ORDER — AMLODIPINE BESYLATE-VALSARTAN 10-320 MG PO TABS: 1.0000 | ORAL_TABLET | Freq: Every day | ORAL | Status: DC

## 2011-12-22 NOTE — Telephone Encounter (Signed)
He needs one month of BP refills until his cpx

## 2011-12-27 ENCOUNTER — Other Ambulatory Visit: Payer: Self-pay | Admitting: Family Medicine

## 2011-12-29 NOTE — Telephone Encounter (Signed)
Okay for one year  

## 2012-01-18 ENCOUNTER — Other Ambulatory Visit: Payer: Self-pay | Admitting: Family Medicine

## 2012-01-23 ENCOUNTER — Other Ambulatory Visit (INDEPENDENT_AMBULATORY_CARE_PROVIDER_SITE_OTHER): Payer: BC Managed Care – PPO

## 2012-01-23 DIAGNOSIS — Z0000 Encounter for general adult medical examination without abnormal findings: Secondary | ICD-10-CM

## 2012-01-23 LAB — CBC WITH DIFFERENTIAL/PLATELET
Eosinophils Relative: 3.2 % (ref 0.0–5.0)
HCT: 45.5 % (ref 39.0–52.0)
Hemoglobin: 15.6 g/dL (ref 13.0–17.0)
Lymphs Abs: 2.2 10*3/uL (ref 0.7–4.0)
Monocytes Relative: 11.1 % (ref 3.0–12.0)
Platelets: 266 10*3/uL (ref 150.0–400.0)
WBC: 7.6 10*3/uL (ref 4.5–10.5)

## 2012-01-23 LAB — POCT URINALYSIS DIPSTICK
Glucose, UA: NEGATIVE
Leukocytes, UA: NEGATIVE
Nitrite, UA: NEGATIVE
Urobilinogen, UA: 0.2

## 2012-01-23 LAB — LIPID PANEL
Cholesterol: 192 mg/dL (ref 0–200)
Total CHOL/HDL Ratio: 5

## 2012-01-23 LAB — BASIC METABOLIC PANEL
BUN: 16 mg/dL (ref 6–23)
GFR: 71.63 mL/min (ref >60.00)
Potassium: 4.2 mEq/L (ref 3.5–5.1)
Sodium: 138 mEq/L (ref 135–145)

## 2012-01-23 LAB — LDL CHOLESTEROL, DIRECT: Direct LDL: 94.5 mg/dL

## 2012-01-23 LAB — HEPATIC FUNCTION PANEL
ALT: 53 U/L (ref 0–53)
AST: 31 U/L (ref 0–37)
Bilirubin, Direct: 0 mg/dL (ref 0.0–0.3)
Total Bilirubin: 0.5 mg/dL (ref 0.3–1.2)

## 2012-01-23 LAB — TSH: TSH: 2.73 u[IU]/mL (ref 0.35–5.50)

## 2012-01-27 ENCOUNTER — Encounter: Payer: Self-pay | Admitting: Family Medicine

## 2012-01-27 NOTE — Progress Notes (Signed)
Quick Note:  Tried to reach pt by phone, no answer, put a copy of results in mail. ______

## 2012-01-28 ENCOUNTER — Ambulatory Visit (INDEPENDENT_AMBULATORY_CARE_PROVIDER_SITE_OTHER): Payer: BC Managed Care – PPO | Admitting: Family Medicine

## 2012-01-28 ENCOUNTER — Encounter: Payer: Self-pay | Admitting: Family Medicine

## 2012-01-28 VITALS — BP 124/78 | HR 107 | Temp 98.3°F | Ht 69.0 in | Wt 220.0 lb

## 2012-01-28 DIAGNOSIS — Z0000 Encounter for general adult medical examination without abnormal findings: Secondary | ICD-10-CM

## 2012-01-28 NOTE — Progress Notes (Signed)
  Subjective:    Patient ID: Darren Calhoun, male    DOB: [birth year], 41 y.o.   MRN: [id number]  HPI 41 yr old male for a cpx. He feels fine and has no complaints.    Review of Systems  Constitutional: Negative.   HENT: Negative.   Eyes: Negative.   Respiratory: Negative.   Cardiovascular: Negative.   Gastrointestinal: Negative.   Genitourinary: Negative.   Musculoskeletal: Negative.   Skin: Negative.   Neurological: Negative.   Hematological: Negative.   Psychiatric/Behavioral: Negative.        Objective:   Physical Exam  Constitutional: He is oriented to person, place, and time. He appears well-developed and well-nourished. No distress.  HENT:  Head: Normocephalic and atraumatic.  Right Ear: External ear normal.  Left Ear: External ear normal.  Nose: Nose normal.  Mouth/Throat: Oropharynx is clear and moist. No oropharyngeal exudate.  Eyes: Conjunctivae and EOM are normal. Pupils are equal, round, and reactive to light. Right eye exhibits no discharge. Left eye exhibits no discharge. No scleral icterus.  Neck: Neck supple. No JVD present. No tracheal deviation present. No thyromegaly present.  Cardiovascular: Normal rate, regular rhythm, normal heart sounds and intact distal pulses.  Exam reveals no gallop and no friction rub.   No murmur heard. Pulmonary/Chest: Effort normal and breath sounds normal. No respiratory distress. He has no wheezes. He has no rales. He exhibits no tenderness.  Abdominal: Soft. Bowel sounds are normal. He exhibits no distension and no mass. There is no tenderness. There is no rebound and no guarding.  Genitourinary: Rectum normal, prostate normal and penis normal. Guaiac negative stool. No penile tenderness.  Musculoskeletal: Normal range of motion. He exhibits no edema and no tenderness.  Lymphadenopathy:    He has no cervical adenopathy.  Neurological: He is alert and oriented to person, place, and time. He has normal reflexes. No cranial  nerve deficit. He exhibits normal muscle tone. Coordination normal.  Skin: Skin is warm and dry. No rash noted. He is not diaphoretic. No erythema. No pallor.  Psychiatric: He has a normal mood and affect. His behavior is normal. Judgment and thought content normal.          Assessment & Plan:  Well exam. We discussed diet and exercise. We will recheck lipids in 6 months .

## 2012-03-29 ENCOUNTER — Encounter: Payer: Self-pay | Admitting: Family Medicine

## 2012-03-29 ENCOUNTER — Ambulatory Visit (INDEPENDENT_AMBULATORY_CARE_PROVIDER_SITE_OTHER): Payer: BC Managed Care – PPO | Admitting: Family Medicine

## 2012-03-29 VITALS — BP 138/90 | HR 104 | Temp 97.8°F | Wt 218.0 lb

## 2012-03-29 DIAGNOSIS — L0390 Cellulitis, unspecified: Secondary | ICD-10-CM

## 2012-03-29 DIAGNOSIS — L0291 Cutaneous abscess, unspecified: Secondary | ICD-10-CM

## 2012-03-29 MED ORDER — MUPIROCIN CALCIUM 2 % EX CREA: TOPICAL_CREAM | Freq: Two times a day (BID) | CUTANEOUS | Status: AC

## 2012-03-29 MED ORDER — DOXYCYCLINE HYCLATE 100 MG PO CAPS: 100.0000 mg | ORAL_CAPSULE | Freq: Two times a day (BID) | ORAL | Status: AC

## 2012-03-29 NOTE — Progress Notes (Signed)
  Subjective:    Patient ID: Darren Calhoun, male    DOB: 05/19/1971, 41 y.o.   MRN: [id number]  HPI Here for an infected tatoo on the left upper arm. He got the tatoo last week, then 2 days ago he developed a fever and a HA. These went away and are not present today. He also saw the tatoo begin to fester up, split open, get tender, and ooze a clear drainage.    Review of Systems  Constitutional: Positive for fever.  HENT: Negative.   Respiratory: Negative.   Cardiovascular: Negative.   Gastrointestinal: Negative.   Neurological: Negative.        Objective:   Physical Exam  Constitutional: He appears well-developed and well-nourished.  Pulmonary/Chest: Effort normal and breath sounds normal.  Skin:       The tattoo site on the left upper arm has widespread scabbing with cracked open areas. No drainage.           Assessment & Plan:  Cover with Bactroban and Doxycycline. Gently clean the area with soap in a hot shower daily.

## 2012-04-14 ENCOUNTER — Ambulatory Visit (INDEPENDENT_AMBULATORY_CARE_PROVIDER_SITE_OTHER): Payer: BC Managed Care – PPO | Admitting: Family Medicine

## 2012-04-14 ENCOUNTER — Encounter: Payer: Self-pay | Admitting: Family Medicine

## 2012-04-14 VITALS — BP 130/78 | HR 100 | Temp 97.5°F | Wt 218.0 lb

## 2012-04-14 DIAGNOSIS — L0390 Cellulitis, unspecified: Secondary | ICD-10-CM

## 2012-04-14 DIAGNOSIS — L0291 Cutaneous abscess, unspecified: Secondary | ICD-10-CM

## 2012-04-14 NOTE — Progress Notes (Signed)
  Subjective:    Patient ID: Darren Calhoun, male    DOB: [birth year], 41 y.o.   MRN: [id number]  HPI Here to follow up a cellulitis on the left upper arm at the site of a tattoo. He has finished the antibiotic, and the area looks much better. It is not painful. He is applying cocoa butter with vitamin E daily. He asks if he should go back to the tattoo parlor to have the site touched up.    Review of Systems  Constitutional: Negative.        Objective:   Physical Exam  Constitutional: He appears well-developed and well-nourished.  Skin:       The site looks much better. The scabbing has resolved. There is some slight redness in areas           Assessment & Plan:  The cellulitis has resolved and the skin is healing up well. I strongly advised him to not have the area touched up or otherwise traumatized any more.

## 2012-04-17 ENCOUNTER — Other Ambulatory Visit: Payer: Self-pay | Admitting: Family Medicine

## 2012-04-19 ENCOUNTER — Ambulatory Visit: Payer: BC Managed Care – PPO | Admitting: Family Medicine

## 2012-07-20 ENCOUNTER — Other Ambulatory Visit: Payer: BC Managed Care – PPO

## 2012-07-27 ENCOUNTER — Ambulatory Visit: Payer: BC Managed Care – PPO | Admitting: Family Medicine

## 2012-08-04 ENCOUNTER — Other Ambulatory Visit (INDEPENDENT_AMBULATORY_CARE_PROVIDER_SITE_OTHER): Payer: BC Managed Care – PPO

## 2012-08-04 DIAGNOSIS — E785 Hyperlipidemia, unspecified: Secondary | ICD-10-CM

## 2012-08-04 LAB — LIPID PANEL
Cholesterol: 174 mg/dL (ref 0–200)
LDL Cholesterol: 112 mg/dL — ABNORMAL HIGH (ref 0–99)
VLDL: 28.2 mg/dL (ref 0.0–40.0)

## 2012-08-10 ENCOUNTER — Ambulatory Visit (INDEPENDENT_AMBULATORY_CARE_PROVIDER_SITE_OTHER): Payer: BC Managed Care – PPO | Admitting: Family Medicine

## 2012-08-10 ENCOUNTER — Encounter: Payer: Self-pay | Admitting: Family Medicine

## 2012-08-10 VITALS — BP 130/80 | Temp 97.7°F | Wt 268.0 lb

## 2012-08-10 DIAGNOSIS — I10 Essential (primary) hypertension: Secondary | ICD-10-CM

## 2012-08-10 DIAGNOSIS — E785 Hyperlipidemia, unspecified: Secondary | ICD-10-CM | POA: Insufficient documentation

## 2012-08-10 NOTE — Progress Notes (Signed)
  Subjective:    Patient ID: Darren Calhoun, male    DOB: 12-06-1971, 41 y.o.   MRN: [id number]  HPI Here for follow up. He feels great and has no concerns. His recent labs show a huge drop in his TG from over 300 to 141. He has eliminated all gluten from his diet, and he feels better.    Review of Systems  Constitutional: Negative.   Respiratory: Negative.   Cardiovascular: Negative.        Objective:   Physical Exam  Constitutional: He appears well-developed and well-nourished.  Neck: No thyromegaly present.  Cardiovascular: Normal rate, regular rhythm, normal heart sounds and intact distal pulses.   Pulmonary/Chest: Effort normal and breath sounds normal.  Lymphadenopathy:    He has no cervical adenopathy.          Assessment & Plan:  He is doing well we will continue with current diet and exercise regimen. Recheck in 6 months

## 2012-12-20 ENCOUNTER — Telehealth: Payer: Self-pay | Admitting: Family Medicine

## 2012-12-20 ENCOUNTER — Ambulatory Visit (INDEPENDENT_AMBULATORY_CARE_PROVIDER_SITE_OTHER): Payer: BC Managed Care – PPO | Admitting: Family Medicine

## 2012-12-20 ENCOUNTER — Encounter: Payer: Self-pay | Admitting: Family Medicine

## 2012-12-20 VITALS — BP 148/88 | HR 99 | Temp 97.9°F | Wt 222.0 lb

## 2012-12-20 DIAGNOSIS — J329 Chronic sinusitis, unspecified: Secondary | ICD-10-CM

## 2012-12-20 MED ORDER — AMOXICILLIN-POT CLAVULANATE 875-125 MG PO TABS: 1.0000 | ORAL_TABLET | Freq: Two times a day (BID) | ORAL | Status: DC

## 2012-12-20 NOTE — Telephone Encounter (Signed)
Per Dr. Fry, okay to schedule. 

## 2012-12-20 NOTE — Progress Notes (Signed)
  Subjective:    Patient ID: Darren Calhoun, male    DOB: 15-Jun-1971, 42 y.o.   MRN: [id number]  HPI Here for one week of sinus pressure, PND, ST, and a dry cough. On Mucinex.    Review of Systems  Constitutional: Negative.   HENT: Positive for congestion, postnasal drip and sinus pressure.   Eyes: Negative.   Respiratory: Positive for cough.        Objective:   Physical Exam  Constitutional: He appears well-developed and well-nourished.  HENT:  Right Ear: External ear normal.  Left Ear: External ear normal.  Nose: Nose normal.  Mouth/Throat: Oropharynx is clear and moist.  Eyes: Conjunctivae normal are normal.  Pulmonary/Chest: Effort normal and breath sounds normal.  Lymphadenopathy:    He has no cervical adenopathy.          Assessment & Plan:  Recheck prn

## 2012-12-20 NOTE — Telephone Encounter (Signed)
Pt needs CPX done after 01/27/13, but needs appt after 3pm in the afternoons. You do not have any CPX after this time. Pls advise.

## 2012-12-21 NOTE — Telephone Encounter (Signed)
Called pt, No answer. Will try back

## 2012-12-21 NOTE — Telephone Encounter (Signed)
Pt aware.

## 2013-01-17 ENCOUNTER — Encounter: Payer: Self-pay | Admitting: Family Medicine

## 2013-01-17 ENCOUNTER — Ambulatory Visit (INDEPENDENT_AMBULATORY_CARE_PROVIDER_SITE_OTHER): Payer: BC Managed Care – PPO | Admitting: Family Medicine

## 2013-01-17 VITALS — BP 144/86 | HR 98 | Temp 97.8°F | Wt 222.0 lb

## 2013-01-17 DIAGNOSIS — J0100 Acute maxillary sinusitis, unspecified: Secondary | ICD-10-CM

## 2013-01-17 MED ORDER — CEFUROXIME AXETIL 500 MG PO TABS: 500.0000 mg | ORAL_TABLET | Freq: Two times a day (BID) | ORAL | Status: DC

## 2013-01-17 NOTE — Progress Notes (Signed)
  Subjective:    Patient ID: Darren Calhoun, male    DOB: 1971/09/12, 42 y.o.   MRN: [id number]  HPI Here for a recurrence of sinusitis. He was seen recently for this and took a course of Augmentin. He felt better but never quite back to normal, then 4 days ago the symptoms returned. He has sinus pressure, PND, HA, and a dry cough. No fever.    Review of Systems  Constitutional: Negative.   HENT: Positive for congestion, postnasal drip and sinus pressure.   Eyes: Negative.   Respiratory: Positive for cough.        Objective:   Physical Exam  Constitutional: He appears well-developed and well-nourished.  HENT:  Right Ear: External ear normal.  Left Ear: External ear normal.  Nose: Nose normal.  Mouth/Throat: Oropharynx is clear and moist.  Eyes: Conjunctivae are normal.  Pulmonary/Chest: Effort normal and breath sounds normal.  Lymphadenopathy:    He has no cervical adenopathy.          Assessment & Plan:  Recurrent sinusitis. Try Ceftin.

## 2013-01-18 ENCOUNTER — Other Ambulatory Visit: Payer: Self-pay | Admitting: Family Medicine

## 2013-01-21 ENCOUNTER — Other Ambulatory Visit: Payer: BC Managed Care – PPO

## 2013-02-02 ENCOUNTER — Encounter: Payer: BC Managed Care – PPO | Admitting: Family Medicine

## 2013-02-02 DIAGNOSIS — Z0289 Encounter for other administrative examinations: Secondary | ICD-10-CM

## 2013-02-15 ENCOUNTER — Telehealth: Payer: Self-pay | Admitting: Family Medicine

## 2013-02-15 MED ORDER — CETIRIZINE-PSEUDOEPHEDRINE ER 5-120 MG PO TB12: 1.0000 | ORAL_TABLET | Freq: Two times a day (BID) | ORAL | Status: DC

## 2013-02-15 NOTE — Telephone Encounter (Signed)
This comes as both a 12 hour version to take bid or a 24 hour to take once a day

## 2013-02-15 NOTE — Telephone Encounter (Signed)
Refill request for Cetirizine-D take 1 po bid, this is how the script request came in from pharmacy, the chart had once a day. Is pt on new dose?

## 2013-02-15 NOTE — Telephone Encounter (Signed)
I sent script e-scribe. 

## 2013-02-21 ENCOUNTER — Other Ambulatory Visit: Payer: Self-pay | Admitting: Family Medicine

## 2013-05-24 ENCOUNTER — Other Ambulatory Visit: Payer: Self-pay | Admitting: Family Medicine

## 2013-06-28 ENCOUNTER — Ambulatory Visit: Payer: BC Managed Care – PPO | Admitting: Family Medicine

## 2013-07-01 ENCOUNTER — Encounter: Payer: Self-pay | Admitting: Family Medicine

## 2013-07-01 ENCOUNTER — Ambulatory Visit (INDEPENDENT_AMBULATORY_CARE_PROVIDER_SITE_OTHER): Payer: BC Managed Care – PPO | Admitting: Family Medicine

## 2013-07-01 VITALS — BP 132/90 | HR 77 | Temp 98.2°F | Wt 223.0 lb

## 2013-07-01 DIAGNOSIS — J309 Allergic rhinitis, unspecified: Secondary | ICD-10-CM

## 2013-07-01 DIAGNOSIS — F411 Generalized anxiety disorder: Secondary | ICD-10-CM

## 2013-07-01 DIAGNOSIS — I10 Essential (primary) hypertension: Secondary | ICD-10-CM

## 2013-07-01 MED ORDER — FLUTICASONE PROPIONATE 50 MCG/ACT NA SUSP: 2.0000 | Freq: Every day | NASAL | Status: DC

## 2013-07-01 MED ORDER — AMLODIPINE BESYLATE-VALSARTAN 10-320 MG PO TABS: ORAL_TABLET | ORAL | Status: DC

## 2013-07-01 NOTE — Progress Notes (Signed)
  Subjective:    Patient ID: Darren Calhoun, male    DOB: 1971-03-17, 42 y.o.   MRN: [id number]  HPI Here for follow up. He feels well. His BP at home is usually in the 130s over 80s.    Review of Systems  Constitutional: Negative.   Respiratory: Negative.   Cardiovascular: Negative.   Psychiatric/Behavioral: Negative.        Objective:   Physical Exam  Constitutional: He appears well-developed and well-nourished.  Cardiovascular: Normal rate, regular rhythm, normal heart sounds and intact distal pulses.   Pulmonary/Chest: Effort normal and breath sounds normal.  Psychiatric: He has a normal mood and affect. His behavior is normal. Thought content normal.          Assessment & Plan:  Refilled meds.

## 2013-07-28 ENCOUNTER — Other Ambulatory Visit (INDEPENDENT_AMBULATORY_CARE_PROVIDER_SITE_OTHER): Payer: BC Managed Care – PPO

## 2013-07-28 DIAGNOSIS — Z0000 Encounter for general adult medical examination without abnormal findings: Secondary | ICD-10-CM

## 2013-07-28 LAB — POCT URINALYSIS DIPSTICK
Bilirubin, UA: NEGATIVE
Ketones, UA: NEGATIVE
Leukocytes, UA: NEGATIVE
Protein, UA: NEGATIVE
Spec Grav, UA: >=1.03
pH, UA: 6

## 2013-07-28 LAB — BASIC METABOLIC PANEL
BUN: 16 mg/dL (ref 6–23)
Calcium: 9 mg/dL (ref 8.4–10.5)
Chloride: 104 mEq/L (ref 96–112)
Creatinine, Ser: 1.2 mg/dL (ref 0.4–1.5)
GFR: 72.47 mL/min (ref >60.00)

## 2013-07-28 LAB — LIPID PANEL
Cholesterol: 174 mg/dL (ref 0–200)
HDL: 31.7 mg/dL — ABNORMAL LOW (ref >39.00)
LDL Cholesterol: 102 mg/dL — ABNORMAL HIGH (ref 0–99)
VLDL: 40 mg/dL (ref 0.0–40.0)

## 2013-07-28 LAB — HEPATIC FUNCTION PANEL
ALT: 59 U/L — ABNORMAL HIGH (ref 0–53)
Alkaline Phosphatase: 68 U/L (ref 39–117)
Bilirubin, Direct: 0 mg/dL (ref 0.0–0.3)
Total Bilirubin: 0.5 mg/dL (ref 0.3–1.2)
Total Protein: 7.2 g/dL (ref 6.0–8.3)

## 2013-07-28 LAB — CBC WITH DIFFERENTIAL/PLATELET
Basophils Relative: 0.8 % (ref 0.0–3.0)
Eosinophils Relative: 3.3 % (ref 0.0–5.0)
Lymphocytes Relative: 40.7 % (ref 12.0–46.0)
Monocytes Relative: 11.1 % (ref 3.0–12.0)
Neutrophils Relative %: 44.1 % (ref 43.0–77.0)
Platelets: 279 10*3/uL (ref 150.0–400.0)
RBC: 4.99 Mil/uL (ref 4.22–5.81)
WBC: 6.6 10*3/uL (ref 4.5–10.5)

## 2013-08-01 NOTE — Progress Notes (Signed)
Quick Note:  I spoke with pt ______ 

## 2013-08-10 ENCOUNTER — Encounter: Payer: BC Managed Care – PPO | Admitting: Family Medicine

## 2013-08-10 DIAGNOSIS — Z0289 Encounter for other administrative examinations: Secondary | ICD-10-CM

## 2013-10-11 ENCOUNTER — Ambulatory Visit (INDEPENDENT_AMBULATORY_CARE_PROVIDER_SITE_OTHER): Payer: BC Managed Care – PPO | Admitting: Family Medicine

## 2013-10-11 ENCOUNTER — Encounter: Payer: Self-pay | Admitting: Family Medicine

## 2013-10-11 VITALS — BP 130/84 | HR 98 | Temp 98.1°F | Wt 220.0 lb

## 2013-10-11 DIAGNOSIS — J019 Acute sinusitis, unspecified: Secondary | ICD-10-CM

## 2013-10-11 MED ORDER — CEFUROXIME AXETIL 500 MG PO TABS: 500.0000 mg | ORAL_TABLET | Freq: Two times a day (BID) | ORAL | Status: DC

## 2013-10-11 NOTE — Progress Notes (Signed)
  Subjective:    Patient ID: Darren Calhoun, male    DOB: 1971/05/09, 42 y.o.   MRN: [id number]  HPI Here for one week of sinus pain, HA, and a dry cough. No fever.    Review of Systems  Constitutional: Negative.   HENT: Positive for congestion and sinus pressure.   Eyes: Negative.   Respiratory: Positive for cough.        Objective:   Physical Exam  Constitutional: He appears well-developed and well-nourished.  HENT:  Right Ear: External ear normal.  Left Ear: External ear normal.  Nose: Nose normal.  Mouth/Throat: Oropharynx is clear and moist.  Eyes: Conjunctivae are normal.  Pulmonary/Chest: Effort normal and breath sounds normal.  Lymphadenopathy:    He has no cervical adenopathy.          Assessment & Plan:  Add Mucinex.

## 2013-11-21 ENCOUNTER — Telehealth: Payer: Self-pay | Admitting: Family Medicine

## 2013-11-21 NOTE — Telephone Encounter (Signed)
Please set them up for tomorrow

## 2013-11-21 NOTE — Telephone Encounter (Signed)
done

## 2013-11-21 NOTE — Telephone Encounter (Signed)
Pt has pos sinus inf. and his daughter is a pt  (40 yrs old) and has poss sinus inf  too. Father would like to bring her and himself in for appt.  Refused another provider. Only same day tomorrow. pls advise.

## 2013-11-22 ENCOUNTER — Encounter: Payer: Self-pay | Admitting: Family Medicine

## 2013-11-22 ENCOUNTER — Ambulatory Visit (INDEPENDENT_AMBULATORY_CARE_PROVIDER_SITE_OTHER): Payer: BC Managed Care – PPO | Admitting: Family Medicine

## 2013-11-22 VITALS — BP 132/80 | HR 94 | Temp 98.0°F | Wt 226.0 lb

## 2013-11-22 DIAGNOSIS — J019 Acute sinusitis, unspecified: Secondary | ICD-10-CM

## 2013-11-22 MED ORDER — CEFUROXIME AXETIL 500 MG PO TABS: 500.0000 mg | ORAL_TABLET | Freq: Two times a day (BID) | ORAL | Status: DC

## 2013-11-22 NOTE — Progress Notes (Signed)
   Subjective:    Patient ID: Darren Calhoun, male    DOB: [birth year], 42 y.o.   MRN: [id number]  HPI Here for one week of sinus pressure , PND, ear pain, and a dry cough. No fever.    Review of Systems  Constitutional: Negative.   HENT: Positive for congestion, ear pain, postnasal drip and sinus pressure.   Eyes: Negative.   Respiratory: Positive for cough.        Objective:   Physical Exam  Constitutional: He appears well-developed and well-nourished.  HENT:  Right Ear: External ear normal.  Left Ear: External ear normal.  Nose: Nose normal.  Mouth/Throat: Oropharynx is clear and moist.  Eyes: Conjunctivae are normal.  Pulmonary/Chest: Effort normal and breath sounds normal.  Lymphadenopathy:    He has no cervical adenopathy.          Assessment & Plan:  Add Mucinex

## 2013-11-22 NOTE — Progress Notes (Signed)
Pre visit review using our clinic review tool, if applicable. No additional management support is needed unless otherwise documented below in the visit note. 

## 2013-12-15 ENCOUNTER — Other Ambulatory Visit: Payer: Self-pay | Admitting: Family Medicine

## 2014-01-12 ENCOUNTER — Other Ambulatory Visit: Payer: Self-pay | Admitting: Family Medicine

## 2014-04-10 ENCOUNTER — Other Ambulatory Visit: Payer: Self-pay | Admitting: Family Medicine

## 2014-07-27 ENCOUNTER — Other Ambulatory Visit: Payer: Self-pay | Admitting: Family Medicine

## 2014-07-28 ENCOUNTER — Telehealth: Payer: Self-pay | Admitting: Family Medicine

## 2014-07-28 MED ORDER — CETIRIZINE-PSEUDOEPHEDRINE ER 5-120 MG PO TB12: 1.0000 | ORAL_TABLET | Freq: Two times a day (BID) | ORAL | Status: DC

## 2014-07-28 NOTE — Telephone Encounter (Signed)
refilled 

## 2014-08-14 ENCOUNTER — Other Ambulatory Visit: Payer: Self-pay | Admitting: Family Medicine

## 2014-08-21 ENCOUNTER — Encounter: Payer: Self-pay | Admitting: Family Medicine

## 2014-08-21 ENCOUNTER — Ambulatory Visit (INDEPENDENT_AMBULATORY_CARE_PROVIDER_SITE_OTHER): Payer: BC Managed Care – PPO | Admitting: Family Medicine

## 2014-08-21 VITALS — BP 117/77 | HR 91 | Temp 98.4°F | Ht 69.0 in | Wt 230.0 lb

## 2014-08-21 DIAGNOSIS — J019 Acute sinusitis, unspecified: Secondary | ICD-10-CM

## 2014-08-21 MED ORDER — FLUTICASONE PROPIONATE 50 MCG/ACT NA SUSP: NASAL | Status: DC

## 2014-08-21 MED ORDER — CEFUROXIME AXETIL 500 MG PO TABS: 500.0000 mg | ORAL_TABLET | Freq: Two times a day (BID) | ORAL | Status: DC

## 2014-08-21 MED ORDER — CETIRIZINE-PSEUDOEPHEDRINE ER 5-120 MG PO TB12: 1.0000 | ORAL_TABLET | Freq: Two times a day (BID) | ORAL | Status: DC

## 2014-08-21 NOTE — Progress Notes (Signed)
   Subjective:    Patient ID: Darren Calhoun, male    DOB: 01-07-1971, 43 y.o.   MRN: [id number]  HPI Here for one week of sinus pressure, PND, and HA. No fever.    Review of Systems  Constitutional: Negative.   HENT: Positive for congestion, postnasal drip and sinus pressure.   Eyes: Negative.   Respiratory: Positive for cough. Negative for shortness of breath and wheezing.        Objective:   Physical Exam  Constitutional: He appears well-developed and well-nourished.  HENT:  Right Ear: External ear normal.  Left Ear: External ear normal.  Nose: Nose normal.  Mouth/Throat: Oropharynx is clear and moist.  Eyes: Conjunctivae are normal.  Pulmonary/Chest: Effort normal and breath sounds normal.  Lymphadenopathy:    He has no cervical adenopathy.          Assessment & Plan:  Add Mucinex.

## 2014-08-21 NOTE — Progress Notes (Signed)
Pre visit review using our clinic review tool, if applicable. No additional management support is needed unless otherwise documented below in the visit note. 

## 2014-08-25 ENCOUNTER — Other Ambulatory Visit: Payer: Self-pay | Admitting: Family Medicine

## 2014-11-27 ENCOUNTER — Other Ambulatory Visit: Payer: BC Managed Care – PPO

## 2014-12-04 ENCOUNTER — Encounter: Payer: BC Managed Care – PPO | Admitting: Family Medicine

## 2015-01-19 ENCOUNTER — Other Ambulatory Visit: Payer: Self-pay | Admitting: Family Medicine

## 2015-01-26 ENCOUNTER — Ambulatory Visit: Payer: BC Managed Care – PPO | Admitting: Family Medicine

## 2015-01-29 ENCOUNTER — Ambulatory Visit (INDEPENDENT_AMBULATORY_CARE_PROVIDER_SITE_OTHER): Payer: BC Managed Care – PPO | Admitting: Family Medicine

## 2015-01-29 ENCOUNTER — Encounter: Payer: Self-pay | Admitting: Family Medicine

## 2015-01-29 VITALS — BP 127/85 | HR 100 | Temp 98.4°F | Ht 69.0 in | Wt 235.0 lb

## 2015-01-29 DIAGNOSIS — J0100 Acute maxillary sinusitis, unspecified: Secondary | ICD-10-CM

## 2015-01-29 MED ORDER — CEFUROXIME AXETIL 500 MG PO TABS
500.0000 mg | ORAL_TABLET | Freq: Two times a day (BID) | ORAL | Status: DC
Start: 2015-01-29 — End: 2015-03-26

## 2015-01-29 NOTE — Progress Notes (Signed)
   Subjective:    Patient ID: Darren Calhoun, male    DOB: 04/08/1971, 44 y.o.   MRN: [id number]  HPI Here for 5 days of sinus pressure, PND, and ST. Some dry cough.    Review of Systems  Constitutional: Negative.   HENT: Positive for congestion, postnasal drip and sinus pressure.   Eyes: Negative.   Respiratory: Positive for cough.        Objective:   Physical Exam  Constitutional: He appears well-developed and well-nourished.  HENT:  Right Ear: External ear normal.  Left Ear: External ear normal.  Nose: Nose normal.  Mouth/Throat: Oropharynx is clear and moist.  Eyes: Conjunctivae are normal.  Pulmonary/Chest: Effort normal and breath sounds normal.  Lymphadenopathy:    He has no cervical adenopathy.          Assessment & Plan:  Add Mucinex

## 2015-01-29 NOTE — Progress Notes (Signed)
Pre visit review using our clinic review tool, if applicable. No additional management support is needed unless otherwise documented below in the visit note. 

## 2015-02-22 ENCOUNTER — Other Ambulatory Visit: Payer: BC Managed Care – PPO

## 2015-03-01 ENCOUNTER — Encounter: Payer: BC Managed Care – PPO | Admitting: Family Medicine

## 2015-03-02 ENCOUNTER — Other Ambulatory Visit: Payer: Self-pay | Admitting: Family Medicine

## 2015-03-26 ENCOUNTER — Ambulatory Visit (INDEPENDENT_AMBULATORY_CARE_PROVIDER_SITE_OTHER): Payer: BC Managed Care – PPO | Admitting: Family Medicine

## 2015-03-26 ENCOUNTER — Encounter: Payer: Self-pay | Admitting: Family Medicine

## 2015-03-26 VITALS — BP 128/88 | HR 93 | Temp 98.3°F | Ht 69.0 in | Wt 235.0 lb

## 2015-03-26 DIAGNOSIS — R319 Hematuria, unspecified: Secondary | ICD-10-CM | POA: Diagnosis not present

## 2015-03-26 LAB — POCT URINALYSIS DIPSTICK
BILIRUBIN UA: NEGATIVE
Glucose, UA: NEGATIVE
KETONES UA: NEGATIVE
LEUKOCYTES UA: NEGATIVE
Nitrite, UA: NEGATIVE
PH UA: 6
Spec Grav, UA: 1.025
Urobilinogen, UA: 0.2

## 2015-03-26 MED ORDER — CIPROFLOXACIN HCL 500 MG PO TABS: 500.0000 mg | ORAL_TABLET | Freq: Two times a day (BID) | ORAL | Status: DC

## 2015-03-26 NOTE — Progress Notes (Signed)
   Subjective:    Patient ID: Darren Calhoun, male    DOB: 03/23/1971, 44 y.o.   MRN: [id number]  HPI Here for one week of urgency to urinate with burning and some blood in the urine. No fever or back pain. Drinking lots of water.    Review of Systems  Constitutional: Negative.   Gastrointestinal: Negative.   Genitourinary: Positive for dysuria, urgency, frequency and hematuria. Negative for flank pain, difficulty urinating and testicular pain.       Objective:   Physical Exam  Constitutional: He appears well-developed and well-nourished. No distress.  Abdominal: Soft. Bowel sounds are normal. He exhibits no distension and no mass. There is no tenderness. There is no rebound and no guarding.          Assessment & Plan:  Probable UTI. Treat with Cipro. Await culture results.

## 2015-03-26 NOTE — Progress Notes (Signed)
Pre visit review using our clinic review tool, if applicable. No additional management support is needed unless otherwise documented below in the visit note. 

## 2015-03-28 LAB — URINE CULTURE
COLONY COUNT: NO GROWTH
ORGANISM ID, BACTERIA: NO GROWTH

## 2015-04-19 ENCOUNTER — Telehealth: Payer: Self-pay | Admitting: Family Medicine

## 2015-04-19 DIAGNOSIS — R319 Hematuria, unspecified: Secondary | ICD-10-CM

## 2015-04-19 NOTE — Telephone Encounter (Signed)
Pt called to ask for a referral to see a Urologist and would like Dr Clent RidgesFry to recommend him a doctor

## 2015-04-23 NOTE — Telephone Encounter (Signed)
The referral was made

## 2015-06-14 ENCOUNTER — Other Ambulatory Visit (INDEPENDENT_AMBULATORY_CARE_PROVIDER_SITE_OTHER): Payer: BC Managed Care – PPO

## 2015-06-14 DIAGNOSIS — Z0000 Encounter for general adult medical examination without abnormal findings: Secondary | ICD-10-CM | POA: Diagnosis not present

## 2015-06-14 DIAGNOSIS — R7989 Other specified abnormal findings of blood chemistry: Secondary | ICD-10-CM | POA: Diagnosis not present

## 2015-06-14 LAB — HEPATIC FUNCTION PANEL
ALBUMIN: 4.2 g/dL (ref 3.5–5.2)
ALT: 70 U/L — ABNORMAL HIGH (ref 0–53)
AST: 29 U/L (ref 0–37)
Alkaline Phosphatase: 47 U/L (ref 39–117)
BILIRUBIN TOTAL: 0.4 mg/dL (ref 0.2–1.2)
Bilirubin, Direct: 0.1 mg/dL (ref 0.0–0.3)
Total Protein: 7.3 g/dL (ref 6.0–8.3)

## 2015-06-14 LAB — BASIC METABOLIC PANEL
BUN: 16 mg/dL (ref 6–23)
CALCIUM: 9.4 mg/dL (ref 8.4–10.5)
CHLORIDE: 102 meq/L (ref 96–112)
CO2: 28 meq/L (ref 19–32)
CREATININE: 1.13 mg/dL (ref 0.40–1.50)
GFR: 75.49 mL/min (ref >60.00)
GLUCOSE: 143 mg/dL — AB (ref 70–99)
Potassium: 4.2 mEq/L (ref 3.5–5.1)
Sodium: 138 mEq/L (ref 135–145)

## 2015-06-14 LAB — CBC WITH DIFFERENTIAL/PLATELET
BASOS PCT: 0.8 % (ref 0.0–3.0)
Basophils Absolute: 0.1 10*3/uL (ref 0.0–0.1)
EOS PCT: 2.8 % (ref 0.0–5.0)
Eosinophils Absolute: 0.2 10*3/uL (ref 0.0–0.7)
HCT: 46.1 % (ref 39.0–52.0)
HEMOGLOBIN: 15.7 g/dL (ref 13.0–17.0)
LYMPHS ABS: 2.6 10*3/uL (ref 0.7–4.0)
Lymphocytes Relative: 32.7 % (ref 12.0–46.0)
MCHC: 34.1 g/dL (ref 30.0–36.0)
MCV: 87.9 fl (ref 78.0–100.0)
MONOS PCT: 10.4 % (ref 3.0–12.0)
Monocytes Absolute: 0.8 10*3/uL (ref 0.1–1.0)
Neutro Abs: 4.2 10*3/uL (ref 1.4–7.7)
Neutrophils Relative %: 53.3 % (ref 43.0–77.0)
PLATELETS: 272 10*3/uL (ref 150.0–400.0)
RBC: 5.24 Mil/uL (ref 4.22–5.81)
RDW: 13.1 % (ref 11.5–15.5)
WBC: 7.9 10*3/uL (ref 4.0–10.5)

## 2015-06-14 LAB — POCT URINALYSIS DIPSTICK
BILIRUBIN UA: NEGATIVE
Glucose, UA: NEGATIVE
Ketones, UA: NEGATIVE
Leukocytes, UA: NEGATIVE
Nitrite, UA: NEGATIVE
RBC UA: NEGATIVE
Spec Grav, UA: >=1.03
UROBILINOGEN UA: 0.2
pH, UA: 5.5

## 2015-06-14 LAB — LDL CHOLESTEROL, DIRECT: Direct LDL: 113 mg/dL

## 2015-06-14 LAB — TSH: TSH: 4.03 u[IU]/mL (ref 0.35–4.50)

## 2015-06-14 LAB — LIPID PANEL
CHOL/HDL RATIO: 7
CHOLESTEROL: 212 mg/dL — AB (ref 0–200)
HDL: 28.6 mg/dL — ABNORMAL LOW (ref >39.00)
NonHDL: 183.4
TRIGLYCERIDES: 338 mg/dL — AB (ref 0.0–149.0)
VLDL: 67.6 mg/dL — ABNORMAL HIGH (ref 0.0–40.0)

## 2015-06-21 ENCOUNTER — Encounter: Payer: Self-pay | Admitting: Family Medicine

## 2015-06-21 ENCOUNTER — Ambulatory Visit (INDEPENDENT_AMBULATORY_CARE_PROVIDER_SITE_OTHER): Payer: BC Managed Care – PPO | Admitting: Family Medicine

## 2015-06-21 VITALS — BP 107/59 | HR 90 | Temp 98.3°F | Ht 69.0 in | Wt 230.0 lb

## 2015-06-21 DIAGNOSIS — R739 Hyperglycemia, unspecified: Secondary | ICD-10-CM | POA: Diagnosis not present

## 2015-06-21 DIAGNOSIS — Z0000 Encounter for general adult medical examination without abnormal findings: Secondary | ICD-10-CM | POA: Diagnosis not present

## 2015-06-21 LAB — HEMOGLOBIN A1C: Hgb A1c MFr Bld: 6.5 % (ref 4.6–6.5)

## 2015-06-21 NOTE — Progress Notes (Signed)
   Subjective:    Patient ID: Darren Calhoun, male    DOB: 02/05/1971, 44 y.o.   MRN: [id number]  HPI 44 yr old male for a cpx. He feels well and has no complaints. He admits to eating a poor diet, not exercising, and gaining weight.    Review of Systems  Constitutional: Negative.   HENT: Negative.   Eyes: Negative.   Respiratory: Negative.   Cardiovascular: Negative.   Gastrointestinal: Negative.   Genitourinary: Negative.   Musculoskeletal: Negative.   Skin: Negative.   Neurological: Negative.   Psychiatric/Behavioral: Negative.        Objective:   Physical Exam  Constitutional: He is oriented to person, place, and time. He appears well-developed and well-nourished. No distress.  HENT:  Head: Normocephalic and atraumatic.  Right Ear: External ear normal.  Left Ear: External ear normal.  Nose: Nose normal.  Mouth/Throat: Oropharynx is clear and moist. No oropharyngeal exudate.  Eyes: Conjunctivae and EOM are normal. Pupils are equal, round, and reactive to light. Right eye exhibits no discharge. Left eye exhibits no discharge. No scleral icterus.  Neck: Neck supple. No JVD present. No tracheal deviation present. No thyromegaly present.  Cardiovascular: Normal rate, regular rhythm, normal heart sounds and intact distal pulses.  Exam reveals no gallop and no friction rub.   No murmur heard. Pulmonary/Chest: Effort normal and breath sounds normal. No respiratory distress. He has no wheezes. He has no rales. He exhibits no tenderness.  Abdominal: Soft. Bowel sounds are normal. He exhibits no distension and no mass. There is no tenderness. There is no rebound and no guarding.  Genitourinary: Rectum normal, prostate normal and penis normal. Guaiac negative stool. No penile tenderness.  Musculoskeletal: Normal range of motion. He exhibits no edema or tenderness.  Lymphadenopathy:    He has no cervical adenopathy.  Neurological: He is alert and oriented to person, place, and time.  He has normal reflexes. No cranial nerve deficit. He exhibits normal muscle tone. Coordination normal.  Skin: Skin is warm and dry. No rash noted. He is not diaphoretic. No erythema. No pallor.  Psychiatric: He has a normal mood and affect. His behavior is normal. Judgment and thought content normal.          Assessment & Plan:  Well exam. We discussed diet and exercise. His recent glucose was elevated but he did eat a large meal within 8 hours of his blood draw. We will send him for an A1c today to better assess his glucose status.

## 2015-06-21 NOTE — Progress Notes (Signed)
Pre visit review using our clinic review tool, if applicable. No additional management support is needed unless otherwise documented below in the visit note. 

## 2015-08-17 ENCOUNTER — Other Ambulatory Visit: Payer: Self-pay | Admitting: Family Medicine

## 2016-02-03 ENCOUNTER — Encounter (HOSPITAL_COMMUNITY): Payer: Self-pay | Admitting: Emergency Medicine

## 2016-02-03 ENCOUNTER — Encounter (HOSPITAL_COMMUNITY): Payer: Self-pay | Admitting: *Deleted

## 2016-02-03 ENCOUNTER — Emergency Department (HOSPITAL_COMMUNITY)
Admission: EM | Admit: 2016-02-03 | Discharge: 2016-02-04 | Disposition: A | Discharge status: Home / Self Care | Payer: BC Managed Care – PPO | Attending: Emergency Medicine | Admitting: Emergency Medicine

## 2016-02-03 ENCOUNTER — Emergency Department (HOSPITAL_COMMUNITY): Payer: BC Managed Care – PPO

## 2016-02-03 ENCOUNTER — Emergency Department (HOSPITAL_COMMUNITY)
Admission: EM | Admit: 2016-02-03 | Discharge: 2016-02-03 | Discharge status: Against Medical Advice | Payer: BC Managed Care – PPO | Source: Home / Self Care

## 2016-02-03 DIAGNOSIS — R1030 Lower abdominal pain, unspecified: Secondary | ICD-10-CM

## 2016-02-03 DIAGNOSIS — R7989 Other specified abnormal findings of blood chemistry: Secondary | ICD-10-CM | POA: Diagnosis not present

## 2016-02-03 DIAGNOSIS — R109 Unspecified abdominal pain: Secondary | ICD-10-CM | POA: Insufficient documentation

## 2016-02-03 DIAGNOSIS — I10 Essential (primary) hypertension: Secondary | ICD-10-CM | POA: Insufficient documentation

## 2016-02-03 DIAGNOSIS — Z79899 Other long term (current) drug therapy: Secondary | ICD-10-CM | POA: Diagnosis not present

## 2016-02-03 DIAGNOSIS — F419 Anxiety disorder, unspecified: Secondary | ICD-10-CM | POA: Insufficient documentation

## 2016-02-03 DIAGNOSIS — R945 Abnormal results of liver function studies: Secondary | ICD-10-CM

## 2016-02-03 DIAGNOSIS — Z7951 Long term (current) use of inhaled steroids: Secondary | ICD-10-CM | POA: Insufficient documentation

## 2016-02-03 LAB — LIPASE, BLOOD: Lipase: 16 U/L (ref 11–51)

## 2016-02-03 LAB — URINALYSIS, ROUTINE W REFLEX MICROSCOPIC
BILIRUBIN URINE: NEGATIVE
Glucose, UA: NEGATIVE mg/dL
HGB URINE DIPSTICK: NEGATIVE
Ketones, ur: NEGATIVE mg/dL
Leukocytes, UA: NEGATIVE
NITRITE: NEGATIVE
PROTEIN: NEGATIVE mg/dL
Specific Gravity, Urine: 1.015 (ref 1.005–1.030)
pH: 7 (ref 5.0–8.0)

## 2016-02-03 LAB — CBC WITH DIFFERENTIAL/PLATELET
BASOS PCT: 0 %
Basophils Absolute: 0 10*3/uL (ref 0.0–0.1)
EOS ABS: 0.1 10*3/uL (ref 0.0–0.7)
Eosinophils Relative: 0 %
HEMATOCRIT: 42.6 % (ref 39.0–52.0)
Hemoglobin: 14.7 g/dL (ref 13.0–17.0)
LYMPHS ABS: 1.4 10*3/uL (ref 0.7–4.0)
Lymphocytes Relative: 9 %
MCH: 29.9 pg (ref 26.0–34.0)
MCHC: 34.5 g/dL (ref 30.0–36.0)
MCV: 86.6 fL (ref 78.0–100.0)
MONO ABS: 1.7 10*3/uL — AB (ref 0.1–1.0)
MONOS PCT: 10 %
Neutro Abs: 12.9 10*3/uL — ABNORMAL HIGH (ref 1.7–7.7)
Neutrophils Relative %: 81 %
Platelets: 259 10*3/uL (ref 150–400)
RBC: 4.92 MIL/uL (ref 4.22–5.81)
RDW: 12.4 % (ref 11.5–15.5)
WBC: 16 10*3/uL — ABNORMAL HIGH (ref 4.0–10.5)

## 2016-02-03 LAB — COMPREHENSIVE METABOLIC PANEL
ALBUMIN: 4.3 g/dL (ref 3.5–5.0)
ALK PHOS: 46 U/L (ref 38–126)
ALT: 116 U/L — AB (ref 17–63)
AST: 86 U/L — AB (ref 15–41)
Anion gap: 11 (ref 5–15)
BILIRUBIN TOTAL: 0.6 mg/dL (ref 0.3–1.2)
BUN: 13 mg/dL (ref 6–20)
CALCIUM: 8.6 mg/dL — AB (ref 8.9–10.3)
CO2: 24 mmol/L (ref 22–32)
Chloride: 97 mmol/L — ABNORMAL LOW (ref 101–111)
Creatinine, Ser: 0.93 mg/dL (ref 0.61–1.24)
GFR calc Af Amer: >60 mL/min (ref >60)
GFR calc non Af Amer: >60 mL/min (ref >60)
GLUCOSE: 137 mg/dL — AB (ref 65–99)
Potassium: 3.8 mmol/L (ref 3.5–5.1)
Sodium: 132 mmol/L — ABNORMAL LOW (ref 135–145)
TOTAL PROTEIN: 7.4 g/dL (ref 6.5–8.1)

## 2016-02-03 MED ORDER — ONDANSETRON HCL 4 MG/2ML IJ SOLN
4.0000 mg | INTRAMUSCULAR | Status: DC | PRN
  Administered 2016-02-03: 4 mg via INTRAVENOUS
  Filled 2016-02-03: qty 2

## 2016-02-03 MED ORDER — KETOROLAC TROMETHAMINE 30 MG/ML IJ SOLN
30.0000 mg | Freq: Once | INTRAMUSCULAR | Status: AC
  Administered 2016-02-03: 30 mg via INTRAVENOUS
  Filled 2016-02-03: qty 1

## 2016-02-03 NOTE — ED Notes (Signed)
Pt brought in by [facility] for c/o abdominal pain that radiated around to the back; pt c/o pain with urination and no BM x 1 day

## 2016-02-03 NOTE — ED Notes (Signed)
Patient complaining of lower abdominal pain starting at 0530 this morning. Denies vomiting or diarrhea.

## 2016-02-03 NOTE — ED Provider Notes (Signed)
CSN: [id number]     Arrival date & time 02/03/16  2052 History   First MD Initiated Contact with Patient 02/03/16 2135     Chief Complaint  Patient presents with  . Abdominal Pain      HPI  Pt was seen at 2140.  Per pt, c/o gradual onset and persistence of constant right sided abd "pain" which radiates into his right flank that began at 0530 this morning.  Has been associated with nausea.  Describes the abd pain as "stabbing" and "aching." States he "went for a walk today" but "that didn't help."  Denies vomiting/diarrhea, no fevers, no back pain, no rash, no CP/SOB, no black or blood in stools, no hematuria, no testicular pain/swelling.       Past Medical History  Diagnosis Date  . Allergy   . Anxiety   . Hypertension   . ZHYQMVHQ(469.6)    Past Surgical History  Procedure Laterality Date  . Wisdom teeth extracted    . Knee arthroscopy      right   Family History  Problem Relation Age of Onset  . Diabetes    . Hyperlipidemia    . Hypertension    . Alcohol abuse    . Depression     Social History  Substance Use Topics  . Smoking status: Never Smoker   . Smokeless tobacco: Never Used  . Alcohol Use: No    Review of Systems ROS: Statement: All systems negative except as marked or noted in the HPI; Constitutional: Negative for fever and chills. ; ; Eyes: Negative for eye pain, redness and discharge. ; ; ENMT: Negative for ear pain, hoarseness, nasal congestion, sinus pressure and sore throat. ; ; Cardiovascular: Negative for chest pain, palpitations, diaphoresis, dyspnea and peripheral edema. ; ; Respiratory: Negative for cough, wheezing and stridor. ; ; Gastrointestinal: +nausea, abd pain. Negative for vomiting, diarrhea, blood in stool, hematemesis, jaundice and rectal bleeding. . ; ; Genitourinary:  Negative for flank pain and hematuria. ; ; Genital:  No penile drainage or rash, no testicular pain or swelling, no scrotal rash or swelling. ;; Musculoskeletal: +LBP. Negative  for neck pain. Negative for swelling and trauma.; ; Skin: Negative for pruritus, rash, abrasions, blisters, bruising and skin lesion.; ; Neuro: Negative for headache, lightheadedness and neck stiffness. Negative for weakness, altered level of consciousness , altered mental status, extremity weakness, paresthesias, involuntary movement, seizure and syncope.      Allergies  Review of patient's allergies indicates no known allergies.  Home Medications   Prior to Admission medications   Medication Sig Start Date End Date Taking? Authorizing Provider  amLODipine-valsartan (EXFORGE) 10-320 MG per tablet TAKE ONE TABLET BY MOUTH DAILY 01/22/15  Yes Nelwyn Salisbury, MD  cetirizine-pseudoephedrine (ZYRTEC-D) 5-120 MG tablet Take 1 tablet by mouth 2 (two) times daily.   Yes Historical Provider, MD  ibuprofen (ADVIL,MOTRIN) 200 MG tablet Take 200 mg by mouth every 6 (six) hours as needed for mild pain.   Yes Historical Provider, MD  Multiple Vitamin (MULTIVITAMIN WITH MINERALS) TABS tablet Take 1 tablet by mouth daily.   Yes Historical Provider, MD  sertraline (ZOLOFT) 100 MG tablet 1 TABLET BY MOUTH ONCE DAILY Patient taking differently: 0.5 TABLET BY MOUTH ONCE DAILY 01/22/15  Yes Nelwyn Salisbury, MD  ciprofloxacin (CIPRO) 500 MG tablet Take 1 tablet (500 mg total) by mouth 2 (two) times daily. Patient not taking: Reported on 06/21/2015 03/26/15   Nelwyn Salisbury, MD  fluticasone [facility]) 50 MCG/ACT  nasal spray PLACE 2 SPRAYS INTO THE NOSE DAILY. Patient taking differently: Place 2 sprays into both nostrils daily as needed for allergies. PLACE 2 SPRAYS INTO THE NOSE DAILY. 08/21/14   Nelwyn Salisbury, MD  WAL-ZYR D 5-120 MG per tablet TAKE 1 TABLET BY MOUTH TWICE DAILY 08/20/15   Nelwyn Salisbury, MD   BP 130/86 mmHg  Pulse 95  Temp(Src) 97.7 F (36.5 C) (Oral)  Resp 16  Ht  (1.778 m)  Wt 210 lb (95.255 kg)  BMI 30.13 kg/m2  SpO2 97% Physical Exam  2145: Physical examination:  Nursing notes reviewed;  Vital signs and O2 SAT reviewed;  Constitutional: Well developed, Well nourished, Well hydrated, In no acute distress; Head:  Normocephalic, atraumatic; Eyes: EOMI, PERRL, No scleral icterus; ENMT: Mouth and pharynx normal, Mucous membranes moist; Neck: Supple, Full range of motion, No lymphadenopathy; Cardiovascular: Regular rate and rhythm, No murmur, rub, or gallop; Respiratory: Breath sounds clear & equal bilaterally, No rales, rhonchi, wheezes.  Speaking full sentences with ease, Normal respiratory effort/excursion; Chest: Nontender, Movement normal; Abdomen: Soft, +mild right sided abd tenderness to palp. No rebound or guarding. Nondistended, Normal bowel sounds; Genitourinary: No CVA tenderness; Spine:  No midline CS, TS, LS tenderness. +TTP right lumbar paraspinal muscles.;; Extremities: Pulses normal, No tenderness, No edema, No calf edema or asymmetry.; Neuro: AA&Ox3, Major CN grossly intact.  Speech clear. No gross focal motor or sensory deficits in extremities.; Skin: Color normal, Warm, Dry.   ED Course  Procedures (including critical care time) Labs Review  Imaging Review  I have personally reviewed and evaluated these images and lab results as part of my medical decision-making.   EKG Interpretation None      MDM  MDM Reviewed: previous chart, nursing note and vitals Reviewed previous: labs Interpretation: labs and CT scan     Results for orders placed or performed during the hospital encounter of 02/03/16  Urinalysis, Routine w reflex microscopic (not at [facility])  Result Value Ref Range   Color, Urine YELLOW YELLOW   APPearance HAZY (A) CLEAR   Specific Gravity, Urine 1.015 1.005 - 1.030   pH 7.0 5.0 - 8.0   Glucose, UA NEGATIVE NEGATIVE mg/dL   Hgb urine dipstick NEGATIVE NEGATIVE   Bilirubin Urine NEGATIVE NEGATIVE   Ketones, ur NEGATIVE NEGATIVE mg/dL   Protein, ur NEGATIVE NEGATIVE mg/dL   Nitrite NEGATIVE NEGATIVE   Leukocytes, UA NEGATIVE NEGATIVE   Comprehensive metabolic panel  Result Value Ref Range   Sodium 132 (L) 135 - 145 mmol/L   Potassium 3.8 3.5 - 5.1 mmol/L   Chloride 97 (L) 101 - 111 mmol/L   CO2 24 22 - 32 mmol/L   Glucose, Bld 137 (H) 65 - 99 mg/dL   BUN 13 6 - 20 mg/dL   Creatinine, Ser 1.61 0.61 - 1.24 mg/dL   Calcium 8.6 (L) 8.9 - 10.3 mg/dL   Total Protein 7.4 6.5 - 8.1 g/dL   Albumin 4.3 3.5 - 5.0 g/dL   AST 86 (H) 15 - 41 U/L   ALT 116 (H) 17 - 63 U/L   Alkaline Phosphatase 46 38 - 126 U/L   Total Bilirubin 0.6 0.3 - 1.2 mg/dL   GFR calc non Af Amer >60 >60 mL/min   GFR calc Af Amer >60 >60 mL/min   Anion gap 11 5 - 15  Lipase, blood  Result Value Ref Range   Lipase 16 11 - 51 U/L  CBC with Differential  Result Value  Ref Range   WBC 16.0 (H) 4.0 - 10.5 K/uL   RBC 4.92 4.22 - 5.81 MIL/uL   Hemoglobin 14.7 13.0 - 17.0 g/dL   HCT 45.4 09.8 - 11.9 %   MCV 86.6 78.0 - 100.0 fL   MCH 29.9 26.0 - 34.0 pg   MCHC 34.5 30.0 - 36.0 g/dL   RDW 14.7 82.9 - 56.2 %   Platelets 259 150 - 400 K/uL   Neutrophils Relative % 81 %   Neutro Abs 12.9 (H) 1.7 - 7.7 K/uL   Lymphocytes Relative 9 %   Lymphs Abs 1.4 0.7 - 4.0 K/uL   Monocytes Relative 10 %   Monocytes Absolute 1.7 (H) 0.1 - 1.0 K/uL   Eosinophils Relative 0 %   Eosinophils Absolute 0.1 0.0 - 0.7 K/uL   Basophils Relative 0 %   Basophils Absolute 0.0 0.0 - 0.1 K/uL   Ct Renal Stone Study 02/04/2016  CLINICAL DATA:  Acute onset of generalized abdominal pain radiating to the back. Dysuria and lack of bowel movements for 1 day. Initial encounter. EXAM: CT ABDOMEN AND PELVIS WITHOUT CONTRAST TECHNIQUE: Multidetector CT imaging of the abdomen and pelvis was performed following the standard protocol without IV contrast. COMPARISON:  None. FINDINGS: The visualized lung bases are clear. There is diffuse fatty infiltration within the liver, with mild sparing about the gallbladder fossa. The spleen is unremarkable in appearance. The gallbladder is within normal  limits. The pancreas and adrenal glands are unremarkable. A nonobstructing 5 mm stone is noted near the lower pole of the right kidney. There is suggestion of a 2.1 cm right renal cyst. The kidneys are otherwise unremarkable. There is no evidence of hydronephrosis. No obstructing ureteral stones are identified. No perinephric stranding is seen. No free fluid is identified. The small bowel is unremarkable in appearance. The stomach is within normal limits. No acute vascular abnormalities are seen. The appendix is normal in caliber, without evidence of appendicitis. The colon is unremarkable in appearance. The bladder is mildly distended and grossly unremarkable. A small urachal remnant is incidentally seen. The prostate is borderline normal in size. No inguinal lymphadenopathy is seen. No acute osseous abnormalities are identified. Vacuum phenomenon is noted at L5-S1. IMPRESSION: 1. No acute abnormality seen within the abdomen or pelvis. 2. Nonobstructing 5 mm stone noted near the lower pole of the right kidney. Suggestion of 2.1 cm right renal cyst. 3. Diffuse fatty infiltration within the liver. Electronically Signed   By: Roanna Raider M.D.   On: 02/04/2016 00:06    1220:  LFT's elevated, but pt does have hx of same and CT scan with fatty liver and nl GB. Pt has tol PO well while in the ED without N/V.  No stooling while in the ED.  Abd benign, resps easy, afebrile/VSS. Feels better and wants to go home now. Tx symptomatically, f/u tomorrow as outpt for US GB, f/u PMD and GI MD this week re: fatty liver/elevated LFT's.     Samuel Jester, DO 02/06/16 2102

## 2016-02-03 NOTE — ED Notes (Signed)
Notified by registration that patient left.  

## 2016-02-04 ENCOUNTER — Ambulatory Visit (INDEPENDENT_AMBULATORY_CARE_PROVIDER_SITE_OTHER): Payer: BC Managed Care – PPO | Admitting: Family Medicine

## 2016-02-04 ENCOUNTER — Encounter: Payer: Self-pay | Admitting: Family Medicine

## 2016-02-04 VITALS — BP 127/72 | HR 102 | Temp 98.6°F | Ht 70.0 in | Wt 230.0 lb

## 2016-02-04 DIAGNOSIS — R1011 Right upper quadrant pain: Secondary | ICD-10-CM | POA: Diagnosis not present

## 2016-02-04 MED ORDER — NAPROXEN 250 MG PO TABS: 250.0000 mg | ORAL_TABLET | Freq: Two times a day (BID) | ORAL | Status: DC | PRN

## 2016-02-04 MED ORDER — ONDANSETRON HCL 4 MG PO TABS: 4.0000 mg | ORAL_TABLET | Freq: Three times a day (TID) | ORAL | Status: DC | PRN

## 2016-02-04 MED ORDER — HYDROMORPHONE HCL 4 MG PO TABS: 4.0000 mg | ORAL_TABLET | Freq: Four times a day (QID) | ORAL | Status: DC | PRN

## 2016-02-04 NOTE — Discharge Instructions (Signed)
°Emergency Department Resource Guide °1) Find a Doctor and Pay Out of Pocket °Although you won't have to find out who is covered by your insurance plan, it is a good idea to ask around and get recommendations. You will then need to call the office and see if the doctor you have chosen will accept you as a new patient and what types of options they offer for patients who are self-pay. Some doctors offer discounts or will set up payment plans for their patients who do not have insurance, but you will need to ask so you aren't surprised when you get to your appointment. ° °2) Contact Your Local Health Department °Not all health departments have doctors that can see patients for sick visits, but many do, so it is worth a call to see if yours does. If you don't know where your local health department is, you can check in your phone book. The CDC also has a tool to help you locate your state's health department, and many state websites also have listings of all of their local health departments. ° °3) Find a Walk-in Clinic °If your illness is not likely to be very severe or complicated, you may want to try a walk in clinic. These are popping up all over the country in pharmacies, drugstores, and shopping centers. They're usually staffed by nurse practitioners or physician assistants that have been trained to treat common illnesses and complaints. They're usually fairly quick and inexpensive. However, if you have serious medical issues or chronic medical problems, these are probably not your best option. ° °No Primary Care Doctor: °- Call [facility] at  832-8000 - they can help you locate a primary care doctor that  accepts your insurance, provides certain services, etc. °- Physician Referral Service- 1-800-533-3463 ° °Chronic Pain Problems: °Organization         Address  Phone   Notes  °[facility]  (336) 297-2271 Patients need to be referred by their primary care doctor.  ° °Medication  Assistance: °Organization         Address  Phone   Notes  °[place] [facility] [address] °[place], [place] [address] (336) 641-8030 --Must be a resident of [place] °-- Must have NO insurance coverage whatsoever (no Medicaid/ Medicare, etc.) °-- The pt. MUST have a primary care doctor that directs their care regularly and follows them in the community °  °[facility]  (866) 331-1348   °[employer]  (888) 892-1162   ° °Agencies that provide inexpensive medical care: °Organization         Address  Phone   Notes  °[facility]  (336) 832-8035   °[facility]    (336) 832-7272   °[facility] [address] °[place], [place] [address] (336) 832-4777   °[facility] [address], °[place] (336) 271-4999   °[facility]    (336) 373-0678   °[facility]    (336) 272-1050   °[facility] ° [address], [place] Phone:  (336) 832-4444, Fax:  (336) 832-4440 Hours of Operation:  9 am - 6 pm, M-F.  Also accepts Medicaid/Medicare and self-pay.  °[facility] ° [address], [place] Phone: (336) 832-3150, Fax: (336) 832-3151. Hours of Operation:  8:30 am - 5:30 pm, M-F.  Also accepts Medicaid and self-pay.  °[facility] [place] [address], [place] Phone: (336) 878-6027   °[facility] [address], [place], [place] (336)723-1848, Ext. 123 Mondays & Thursdays: 7-9 AM.  First 15 patients are seen on a first come, first serve basis. °  ° °Medicaid-accepting [place] Providers: ° °Organization         Address  Phone   Notes  °[facility] [address], [place] (336) 641-2100 Also accepts self-pay patients.  °[facility] [address], [place] ° (336) 856-9996   °[facility] [address], [place]  (336) 288-8857   °[facility] [address] [place] [address], [place] (336) 299-7000   °Veita Bland [address], [place]  ° (336) 373-1557 Only accepts [place] Access Medicaid patients after they have their name applied to their card.  ° °Self-Pay (no insurance) in [place]: ° °Organization         Address  Phone   Notes  °Sickle Cell Patients, [facility] [address], [place] (336) 832-1970   °[facility] [address], [place] (336) 832-4400   °[facility] ° [address], [place] (336) 992-4800   °[facility]/Dr. Osei-Bonsu ° [address] [place] [address], [place] or [address], [place] (336) 841-8500 Phone number for both [place] and [place] locations is the same.  °[facility] [address], [place] (336) 299-0000   °[facility] [address] [place] [address], [place] or [address] (336) 852-7530 °(336) 878-2260   °[facility] [address], [place] (336) 350-1642, phone; (336) 294-5005, fax Sees patients 1st and 3rd Saturday of every month.  Must not qualify for public or private insurance (i.e. Medicaid, Medicare, [place] Health Choice, Veterans' Benefits) • Household income should be no more than 200% of the poverty level •The clinic cannot treat you if you are pregnant or think you are pregnant • Sexually transmitted diseases are not treated at the clinic.  ° ° °Dental Care: °Organization         Address  Phone  Notes  °[place] [facility] [address], [place] (336) 641-6152 Accepts children up to age 21 who are enrolled in Medicaid or [place] Health Choice; pregnant women with a Medicaid card; and children who have applied for Medicaid or [place] Health Choice, but were declined, whose parents can pay a reduced fee at time of service.  °[place]  [facility] [place]  [address], [place] (336) 641-7733 Accepts children up to age 21 who are enrolled in Medicaid or [place] Health Choice; pregnant women with a Medicaid card; and children who have applied for Medicaid or [place] Health Choice, but were declined, whose parents can pay a reduced fee at time of service.  °[facility] ° [address], [place] (336) 641-4533 Patients are seen by appointment only. Walk-ins are not accepted. [facility] will see patients 18 years of age and older. °Monday - Tuesday (8am-5pm) °Most Wednesdays (8:30-5pm) °$30 per visit, cash only  °[facility] ° [address], [place] (336) 641-4533 Patients are seen by appointment only. Walk-ins are not accepted. [facility] will see patients 18 years of age and older. °One   Wednesday Evening (Monthly: Volunteer Based).  $30 per visit, cash only  °[facility]  (919) 537-3737 for adults; Children under age 4, call Graduate Pediatric Dentistry at (919) [phone]. Children aged 4-14, please call (919) [phone] to request a pediatric application. ° Dental services are provided in all areas of dental care including fillings, crowns and bridges, complete and partial dentures, implants, gum treatment, root canals, and extractions. Preventive care is also provided. Treatment is provided to both adults and children. °Patients are selected via a lottery and there is often a waiting list. °  °[facility] [address], °[place] ° (336) 763-8833 [email or web address] °  °[facility] [address], [place], [place] (336)723-1848, Ext. 123 Second and Fourth Thursday of each month, opens at 6:30 AM; Clinic ends at 9 AM.  Patients are seen on a first-come first-served basis, and a limited number are seen during each clinic.  ° °[facility] ° [address], [place], [place] (336) 723-7904    Eligibility Requirements °You must have lived in [place], [place], or [place] counties for at least the last three months. °  You cannot be eligible for state or federal sponsored healthcare insurance, including Veterans Administration, Medicaid, or Medicare. °  You generally cannot be eligible for healthcare insurance through your employer.  °  How to apply: °Eligibility screenings are held every Tuesday and Wednesday afternoon from 1:00 pm until 4:00 pm. You do not need an appointment for the interview!  °[facility] [address], [place], [place] 336-631-2330   °[facility]  336-342-8273   °[facility]  336-703-3100   °[facility]  336-570-6415   ° °Behavioral Health Resources in the Community: °Intensive Outpatient Programs °Organization         Address  Phone  Notes  °[facility] [address], [place], [place] 336-878-6098   °[facility] [address], [place], [place] 336-832-9800   °[facility] [address], [place], [place] ° 336-882-2125   °[facility] [address],  °[place], [place] 1-800-853-5163 or 336-641-4981   °Substance Abuse Resources °Organization         Address  Phone  Notes  °[facility]  336-882-2125   °[facility]  336-784-9470   °[facility]  336-285-9073   °[facility]  336-845-3988   °Residential & Outpatient Substance Abuse Program  1-800-659-3381   °Psychological Services °Organization         Address  Phone  Notes  °[facility]  336- 832-9600   °[facility]  336- 378-7881   °[facility] [address], [place] 1-800-853-5163 or 336-641-4981   ° °Mobile Crisis Teams °Organization         Address  Phone  Notes  °[facility], Mobile Crisis Care Unit  1-877-626-1772   °Assertive °[facility] ° [address]  [place], [place] 336-834-9664   °Sharon DeEsch [address] °[place] 336-554-5454   ° °Self-Help/Support Groups °Organization         Address  Phone             Notes  °[facility]  - variety of support groups  336- 373-1402 Call for more information  °Narcotics Anonymous (NA), [facility] [address], °[place]  2 meetings at this location  ° °  Residential Treatment Programs Organization         Address  Phone  Notes  [facility] [address],    [place]  9-604-540-9811   [facility]  [address] [place] [address], [place], [place] 956-213-0865   [facility] [address] [place], [place] 784-696-2952 Admissions: 8am-3pm M-F  [facility] [address]    [place], [place] 841-324-4010   [facility] [address] [place], [place], [place] 272-536-6440   [facility] [address]  [place], [place] 347-425-9563   [facility] - Intensive Outpatient [address], [place], [place] 875-643-3295   [facility] [address] [place].,  [place], [place] 1-884-166-0630 or (289)529-2272   [facility] ([facility] [address] [place], [place] 573-220-2542 Accepts Medicaid  [facility] [address],  [place] 7-062-376-2831 Substance Abuse/Addiction Treatment   [facility]         Address  Phone  Notes  [facility]  9516726612   Angie Fava, PhD [address] [place], [place]   (860) 088-6636 or (424)730-6616   [facility]   [address] [place], [place] (514)374-2165   [facility] [address] [place], [place] (302)322-9162 Insurance/Medicaid/sponsorship through [facility] [address]                                    [place], [place] [phone] Therapy/tele-psych/case    [facility] [address], [place] 215-108-3644    Dr. Lolly Mustache  ([phone]) [phone]   [facility]  [facility] Dept. 1) [address],  2) [address], [place] 3)  [address], [place] [phone] [phone]  (480)[phone]   [facility] Child Abuse Hotline 310-417-5068 or (450) 166-2647 (After Hours)      Take the prescriptions as directed. Eat a bland diet and advance to your regular diet slowly as you can tolerate it. Avoid alcohol and tylenol (and tylenol containing products) until you are seen in follow up. Call your regular medical doctor this morning to schedule a follow up appointment in the next 2 days. Call the GI doctor this morning to schedule a follow up appointment this week. Return to the hospital tomorrow morning for your outpatient ultrasound as instructed.  Return to the Emergency Department immediately if not improving (or even worsening) despite taking the medicines as prescribed, any black or bloody stool or vomit, if you develop a fever over "101," or for any other concerns.

## 2016-02-04 NOTE — Progress Notes (Signed)
Pre visit review using our clinic review tool, if applicable. No additional management support is needed unless otherwise documented below in the visit note. 

## 2016-02-05 ENCOUNTER — Encounter: Payer: Self-pay | Admitting: Family Medicine

## 2016-02-05 ENCOUNTER — Telehealth: Payer: Self-pay | Admitting: Family Medicine

## 2016-02-05 LAB — URINE CULTURE: Culture: 1000

## 2016-02-05 NOTE — Telephone Encounter (Signed)
Pt was seen on 02-04-16 and is waiting on a referral to gi ?kidney stones. I asked the wife did she mean urologist she has know. Please clarify

## 2016-02-05 NOTE — Telephone Encounter (Signed)
See below

## 2016-02-05 NOTE — Telephone Encounter (Signed)
He was referred to GI as we discussed

## 2016-02-05 NOTE — Progress Notes (Signed)
   Subjective:    Patient ID: Darren Calhoun, male    DOB: 14-Jul-1971, 45 y.o.   MRN: [id number]  HPI Here to follow up an ER visit on 02-03-16 for right flank pain. He had no fever and no nausea. Appetite was normal. No change in urinations. His last BM was 3 days prior however. Labs revealed an elevated WBC to 16.0 with a left shift, and transaminases were elevated with an AST of 86 and an ALT of 116 ( as compared to normal a few months ago). His total bilirubin was normal at 0.6.  His exam revealed right abdominal tenderness. A renal CT scan showed no stones or signs of obstruction, but his liver showed diffuse fatty infiltration.    Review of Systems  Constitutional: Negative.   HENT: Negative.   Eyes: Negative.   Respiratory: Negative.   Cardiovascular: Negative.   Gastrointestinal: Positive for nausea, abdominal pain, constipation and rectal pain. Negative for vomiting, diarrhea, blood in stool and abdominal distention.  Genitourinary: Negative.        Objective:   Physical Exam  Constitutional: He is oriented to person, place, and time. He appears well-developed and well-nourished. No distress.  Neck: No thyromegaly present.  Cardiovascular: Normal rate, regular rhythm, normal heart sounds and intact distal pulses.   Pulmonary/Chest: Effort normal and breath sounds normal.  Abdominal: Soft. Bowel sounds are normal. He exhibits no distension and no mass. There is tenderness. There is no rebound and no guarding.  Tender over the RUQ  Musculoskeletal: He exhibits no edema.  Lymphadenopathy:    He has no cervical adenopathy.  Neurological: He is alert and oriented to person, place, and time.          Assessment & Plan:  RUQ pain with elevated transaminases and elevated WBC count. We will refer to GI to evaluate.

## 2016-02-06 ENCOUNTER — Encounter: Payer: Self-pay | Admitting: Gastroenterology

## 2016-02-06 ENCOUNTER — Telehealth: Payer: Self-pay

## 2016-02-06 ENCOUNTER — Telehealth: Payer: Self-pay | Admitting: Family Medicine

## 2016-02-06 NOTE — Telephone Encounter (Signed)
Tell them to keep the appt with GI, but have him see me tomorrow to re-evaluate

## 2016-02-06 NOTE — Telephone Encounter (Signed)
Spoke with Gavin Pound - She was able to get appt moved up to March 15. I notified patient's wife and she was very unhappy - she states that patient has an infection and needs something done right away. She wants an appt today with GI, or to see Dr. Clent Ridges today so he can prescribe something for his infection.

## 2016-02-06 NOTE — Telephone Encounter (Signed)
Appt scheduled tomorrow morning with Dr. Clent Ridges -Lorain Childes

## 2016-02-06 NOTE — Telephone Encounter (Signed)
Call in Ondansetron 8 mg to take every 8 hours prn nausea, #60 with 2 rf

## 2016-02-06 NOTE — Telephone Encounter (Signed)
Patient has an appointment in April his wife stated he needs to be seen today.. Please give her a call

## 2016-02-06 NOTE — Telephone Encounter (Signed)
Wife called back and states pt needs appointment today.

## 2016-02-06 NOTE — Telephone Encounter (Signed)
Pt's wife called to see about getting an appointment. I told her that we would need a referral first. He has an appointment to see [facility] on 02/20/16 but that is to long out for them. I told her that I would need the referral before we could get him an appointment. She then said so he will not be seen with in two days I told her that I did not have any appointment right now until next week. Not sure what she is going to do but she said that he could not take pain medication for two weeks.

## 2016-02-06 NOTE — Telephone Encounter (Signed)
Rx called in as directed.   

## 2016-02-06 NOTE — Telephone Encounter (Signed)
Pt was prescribed ondansetron at er. Pt needs new rx send to [facility] [address]

## 2016-02-07 ENCOUNTER — Encounter: Payer: Self-pay | Admitting: Family Medicine

## 2016-02-07 ENCOUNTER — Ambulatory Visit (INDEPENDENT_AMBULATORY_CARE_PROVIDER_SITE_OTHER): Payer: BC Managed Care – PPO | Admitting: Family Medicine

## 2016-02-07 VITALS — BP 117/85 | HR 93 | Temp 98.3°F | Ht 70.0 in | Wt 228.0 lb

## 2016-02-07 DIAGNOSIS — R740 Nonspecific elevation of levels of transaminase and lactic acid dehydrogenase [LDH]: Secondary | ICD-10-CM | POA: Diagnosis not present

## 2016-02-07 DIAGNOSIS — R17 Unspecified jaundice: Secondary | ICD-10-CM | POA: Diagnosis not present

## 2016-02-07 DIAGNOSIS — R7401 Elevation of levels of liver transaminase levels: Secondary | ICD-10-CM

## 2016-02-07 LAB — CBC WITH DIFFERENTIAL/PLATELET
BASOS PCT: 0.7 % (ref 0.0–3.0)
Basophils Absolute: 0.1 10*3/uL (ref 0.0–0.1)
EOS PCT: 3 % (ref 0.0–5.0)
Eosinophils Absolute: 0.2 10*3/uL (ref 0.0–0.7)
HEMATOCRIT: 44 % (ref 39.0–52.0)
HEMOGLOBIN: 15.3 g/dL (ref 13.0–17.0)
LYMPHS PCT: 20.5 % (ref 12.0–46.0)
Lymphs Abs: 1.5 10*3/uL (ref 0.7–4.0)
MCHC: 34.8 g/dL (ref 30.0–36.0)
MCV: 87.2 fl (ref 78.0–100.0)
Monocytes Absolute: 0.9 10*3/uL (ref 0.1–1.0)
Monocytes Relative: 12.3 % — ABNORMAL HIGH (ref 3.0–12.0)
Neutro Abs: 4.7 10*3/uL (ref 1.4–7.7)
Neutrophils Relative %: 63.5 % (ref 43.0–77.0)
Platelets: 314 10*3/uL (ref 150.0–400.0)
RBC: 5.05 Mil/uL (ref 4.22–5.81)
RDW: 13.2 % (ref 11.5–15.5)
WBC: 7.4 10*3/uL (ref 4.0–10.5)

## 2016-02-07 LAB — BASIC METABOLIC PANEL
BUN: 18 mg/dL (ref 6–23)
CALCIUM: 9.6 mg/dL (ref 8.4–10.5)
CHLORIDE: 97 meq/L (ref 96–112)
CO2: 31 meq/L (ref 19–32)
CREATININE: 1.07 mg/dL (ref 0.40–1.50)
GFR: 80.15 mL/min (ref >60.00)
Glucose, Bld: 142 mg/dL — ABNORMAL HIGH (ref 70–99)
Potassium: 4.1 mEq/L (ref 3.5–5.1)
Sodium: 136 mEq/L (ref 135–145)

## 2016-02-07 LAB — POC URINALSYSI DIPSTICK (AUTOMATED)
Bilirubin, UA: NEGATIVE
Blood, UA: NEGATIVE
GLUCOSE UA: NEGATIVE
Ketones, UA: NEGATIVE
LEUKOCYTES UA: NEGATIVE
NITRITE UA: POSITIVE
Spec Grav, UA: 1.02
UROBILINOGEN UA: 2
pH, UA: 6

## 2016-02-07 LAB — HEPATIC FUNCTION PANEL
ALT: 1473 U/L — ABNORMAL HIGH (ref 0–53)
AST: 475 U/L — ABNORMAL HIGH (ref 0–37)
Albumin: 4.5 g/dL (ref 3.5–5.2)
Alkaline Phosphatase: 97 U/L (ref 39–117)
BILIRUBIN DIRECT: 0.5 mg/dL — AB (ref 0.0–0.3)
BILIRUBIN TOTAL: 1.5 mg/dL — AB (ref 0.2–1.2)
Total Protein: 7.4 g/dL (ref 6.0–8.3)

## 2016-02-07 LAB — AMYLASE: Amylase: 23 U/L — ABNORMAL LOW (ref 27–131)

## 2016-02-07 LAB — LIPASE: LIPASE: 77 U/L — AB (ref 11.0–59.0)

## 2016-02-07 LAB — AMMONIA: Ammonia: 46 umol/L — ABNORMAL HIGH (ref 11–35)

## 2016-02-07 NOTE — Progress Notes (Signed)
Pre visit review using our clinic review tool, if applicable. No additional management support is needed unless otherwise documented below in the visit note. 

## 2016-02-07 NOTE — Progress Notes (Signed)
   Subjective:    Patient ID: Darren Calhoun, male    DOB: 12-Sep-1971, 45 y.o.   MRN: [id number]  HPI Here to follow up on RUQ pain with an elevetd WBC to 16.0 and mildly elevated liver transaminases. He recently had an unremarkable abdominal CT scan. He has an appt with GI on 02-20-16 but I asked to see him today for a follow up. He is accompanied by his wife. Since I saw him he still has constant RUQ pain but this is made tolerable by Dilaudid. He is nauseated but he is using Zofran to help with this. No vomiting. His appetite has greatly decreased. He has eaten no food today buthe is drinking fluids. No fever. He has had several small formed BMs today. No urinary symptoms. His urine has turned to an orange or brown color however.    Review of Systems  Constitutional: Positive for appetite change and fatigue. Negative for fever, chills and diaphoresis.  HENT: Negative.   Eyes: Negative.   Respiratory: Negative.   Cardiovascular: Negative.   Gastrointestinal: Positive for nausea and abdominal pain. Negative for vomiting, diarrhea, constipation, blood in stool, abdominal distention, anal bleeding and rectal pain.  Genitourinary: Positive for flank pain. Negative for dysuria, urgency, frequency and hematuria.  Neurological: Negative.   Hematological: Negative.        Objective:   Physical Exam  Constitutional: He is oriented to person, place, and time. He appears well-developed and well-nourished. No distress.  Eyes: Pupils are equal, round, and reactive to light. Scleral icterus is present.  He has slight jaundice   Neck: Normal range of motion. Neck supple. No thyromegaly present.  Cardiovascular: Normal rate, regular rhythm, normal heart sounds and intact distal pulses.   Pulmonary/Chest: Effort normal and breath sounds normal.  Abdominal: Soft. Bowel sounds are normal. He exhibits no distension and no mass. There is no rebound and no guarding.  Moderately tender in the RUQ, no HSM    Musculoskeletal: He exhibits no edema.  Lymphadenopathy:    He has no cervical adenopathy.  Neurological: He is alert and oriented to person, place, and time.  Skin: No rash noted. No erythema.          Assessment & Plan:  He has evidence of liver inflammation with elevated transaminases, RUQ pain, and now jaundice. We will get more labs today including hepatic and pancreatic indices. Check for infectious etiologies like hepatitis A or B or C, EBV. CMV, and HIV. I encouraged him to get some nutrition down like Boost shakes, etc.

## 2016-02-08 ENCOUNTER — Encounter: Payer: Self-pay | Admitting: Family Medicine

## 2016-02-08 ENCOUNTER — Ambulatory Visit (INDEPENDENT_AMBULATORY_CARE_PROVIDER_SITE_OTHER): Payer: BC Managed Care – PPO | Admitting: Family Medicine

## 2016-02-08 ENCOUNTER — Telehealth: Payer: Self-pay | Admitting: Internal Medicine

## 2016-02-08 ENCOUNTER — Telehealth: Payer: Self-pay | Admitting: Family Medicine

## 2016-02-08 VITALS — BP 120/76 | HR 91 | Temp 98.6°F | Ht 70.0 in | Wt 228.0 lb

## 2016-02-08 DIAGNOSIS — R740 Nonspecific elevation of levels of transaminase and lactic acid dehydrogenase [LDH]: Secondary | ICD-10-CM | POA: Diagnosis not present

## 2016-02-08 DIAGNOSIS — R7401 Elevation of levels of liver transaminase levels: Secondary | ICD-10-CM

## 2016-02-08 LAB — HIV ANTIBODY (ROUTINE TESTING W REFLEX): HIV: NONREACTIVE

## 2016-02-08 LAB — CYTOMEGALOVIRUS ANTIBODY, IGG: Cytomegalovirus Ab-IgG: <0.2 U/mL (ref <0.60)

## 2016-02-08 LAB — HEPATITIS C ANTIBODY: HCV AB: NEGATIVE

## 2016-02-08 LAB — HEPATITIS A ANTIBODY, TOTAL: Hep A Total Ab: NONREACTIVE

## 2016-02-08 LAB — CMV IGM: CMV IgM: <8 AU/mL (ref <30.00)

## 2016-02-08 LAB — EPSTEIN-BARR VIRUS VCA, IGM: EBV VCA IgM: 45.1 U/mL — ABNORMAL HIGH (ref <36.0)

## 2016-02-08 LAB — HEPATITIS B SURFACE ANTIGEN: HEP B S AG: NEGATIVE

## 2016-02-08 LAB — EPSTEIN-BARR VIRUS VCA, IGG: EBV VCA IgG: 208 U/mL — ABNORMAL HIGH (ref <18.0)

## 2016-02-08 NOTE — Telephone Encounter (Signed)
Patient advised of appt

## 2016-02-08 NOTE — Telephone Encounter (Signed)
Patient has been scheduled to see Dr Christella HartiganJacobs on 02/11/16 @ 10:00 am.

## 2016-02-08 NOTE — Telephone Encounter (Signed)
Dr. Fry spoke with pt and went over results.  

## 2016-02-08 NOTE — Telephone Encounter (Signed)
Pt would like results of labs. Please call back. °

## 2016-02-08 NOTE — Telephone Encounter (Signed)
Contacted by Dr. Clent RidgesFry as MD of the day for GI Pt with RUQ pain, transient elevated WBC, malaise, fatigue Recent CT abd -- fatty liver, small kidney stone (noncontrast) Marked elevation in AST/ALT and small bump in T bili.   + EBV IgM and IgG indicates probable recent infection Hep B Surg Ag is neg Hep C ab is neg Hep A total is nonreactive HIV neg Pt seen yesterday -- mental status normal.  My recommendation to Dr. Clent RidgesFry -- Add Hep A IgM, Hep BeAg, Hep B core IgM ANA, IGG ASMA INR Ceruloplasmin US liver with doppler Repeat hepatic function panel Advise patient to stay with/near family this weekend and to ED immediately for any change in mental status  This may be acute EBV infection, but will exclude other causes and watch closely/trend LFTs and INR  Dottie, patient needs to be seen next week (early week if at all possible) by me, APP, or any MD with available slot

## 2016-02-08 NOTE — Progress Notes (Signed)
Pre visit review using our clinic review tool, if applicable. No additional management support is needed unless otherwise documented below in the visit note. 

## 2016-02-08 NOTE — Telephone Encounter (Signed)
Wife calling back for results.

## 2016-02-09 LAB — HEPATIC FUNCTION PANEL
ALT: 886 U/L — ABNORMAL HIGH (ref 9–46)
AST: 145 U/L — ABNORMAL HIGH (ref 10–40)
Albumin: 4.2 g/dL (ref 3.6–5.1)
Alkaline Phosphatase: 87 U/L (ref 40–115)
Bilirubin, Direct: 0.2 mg/dL
Indirect Bilirubin: 0.4 mg/dL (ref 0.2–1.2)
Total Bilirubin: 0.6 mg/dL (ref 0.2–1.2)
Total Protein: 7.2 g/dL (ref 6.1–8.1)

## 2016-02-09 LAB — HEPATITIS B CORE ANTIBODY, IGM: HEP B C IGM: NONREACTIVE

## 2016-02-09 LAB — PROTIME-INR
INR: 1.01
Prothrombin Time: 13.4 s (ref 11.6–15.2)

## 2016-02-09 LAB — HEPATITIS A ANTIBODY, IGM: Hep A IgM: NONREACTIVE

## 2016-02-11 ENCOUNTER — Ambulatory Visit (INDEPENDENT_AMBULATORY_CARE_PROVIDER_SITE_OTHER): Payer: BC Managed Care – PPO | Admitting: Gastroenterology

## 2016-02-11 ENCOUNTER — Other Ambulatory Visit: Payer: Self-pay | Admitting: Family Medicine

## 2016-02-11 ENCOUNTER — Encounter: Payer: Self-pay | Admitting: Gastroenterology

## 2016-02-11 ENCOUNTER — Encounter: Payer: Self-pay | Admitting: Family Medicine

## 2016-02-11 ENCOUNTER — Other Ambulatory Visit (INDEPENDENT_AMBULATORY_CARE_PROVIDER_SITE_OTHER): Payer: BC Managed Care – PPO

## 2016-02-11 VITALS — BP 124/86 | HR 84 | Ht 70.0 in | Wt 227.0 lb

## 2016-02-11 DIAGNOSIS — K7200 Acute and subacute hepatic failure without coma: Secondary | ICD-10-CM | POA: Diagnosis not present

## 2016-02-11 DIAGNOSIS — R1011 Right upper quadrant pain: Secondary | ICD-10-CM | POA: Diagnosis not present

## 2016-02-11 DIAGNOSIS — B179 Acute viral hepatitis, unspecified: Secondary | ICD-10-CM

## 2016-02-11 LAB — HEPATIC FUNCTION PANEL
ALK PHOS: 114 U/L (ref 39–117)
ALT: 628 U/L — ABNORMAL HIGH (ref 0–53)
AST: 238 U/L — AB (ref 0–37)
Albumin: 4.3 g/dL (ref 3.5–5.2)
BILIRUBIN TOTAL: 0.6 mg/dL (ref 0.2–1.2)
Bilirubin, Direct: 0.1 mg/dL (ref 0.0–0.3)
Total Protein: 7.3 g/dL (ref 6.0–8.3)

## 2016-02-11 LAB — HEPATITIS B SURFACE ANTIBODY,QUALITATIVE: HEP B S AB: NEGATIVE

## 2016-02-11 LAB — ANA: Anti Nuclear Antibody(ANA): NEGATIVE

## 2016-02-11 LAB — HEPATITIS B E ANTIBODY: Hepatitis Be Antibody: NONREACTIVE

## 2016-02-11 LAB — CERULOPLASMIN: CERULOPLASMIN: 25 mg/dL (ref 18–36)

## 2016-02-11 LAB — IGG: IgG (Immunoglobin G), Serum: 1240 mg/dL (ref 650–1600)

## 2016-02-11 NOTE — Patient Instructions (Signed)
You will have labs checked today in the basement lab.  Please head down after you check out with the front desk  (hepatitis B surface antibody, LFTs)

## 2016-02-11 NOTE — Progress Notes (Signed)
HPI: This is a   very pleasant 45 year old man    who was referred to me by Nelwyn SalisburyFry, Stephen A, MD  to evaluate  acute hepatitis .    Chief complaint is acute hepatitis  Awoke with RUQ pains, pretty severe  about 10 days ago.  Went to ER.  Saw PCP a few days later, given pain meds.  Doesn't recall feeling poorly prior to the onset of acute pains.  Needs hydromophone to help sleep.  No family history of liver disease.  Overall stable weight.  Was pretty nauseas at first, felt flulike.  Appetite improved now though.  bloodwork 02/2016: INR normal; Hepatitis A total and IgM negative, hepatitis B surface antigen negative, hepatitis B core antibody negative, hepatitis C antibody negative, ceruloplasmin level normal, CMV IgG and IgM were negative,  HIV negative. EBV viral capsid IgG positive, EBV viral capsid IgM positive;  ANA pending, Ceruloplasm pending, Anti SMA pending, Hep Be Antigen pending.       Ref Range 3d ago  4d ago  8d ago  73mo ago  8045yr ago  3461yr ago      Total Bilirubin 0.2 - 1.2 mg/dL 0.6 1.5 (H) 4.0J0.6R 0.4 8.1X0.5R 0.5R    Bilirubin, Direct <=0.2 mg/dL 0.2 0.5 (H)R  9.1Y0.1R 7.8G0.0R 0.0R    Indirect Bilirubin 0.2 - 1.2 mg/dL 0.4         Alkaline Phosphatase 40 - 115 U/L 87 97R 46R 47R 68R 45R    AST 10 - 40 U/L 145 (H) 475 (H)R 86 (H)R 29R 34R 31R    ALT 9 - 46 U/L 886 (H) 1473 (H)R 116 (H)R 70 (H)R 59 (H)R 53R          Review of systems: Pertinent positive and negative review of systems were noted in the above HPI section. Complete review of systems was performed and was otherwise normal.   Past Medical History  Diagnosis Date  . Allergy   . Anxiety   . Hypertension   . NFAOZHYQ(657.8Headache(784.0)     Past Surgical History  Procedure Laterality Date  . Wisdom teeth extracted    . Knee arthroscopy      right    Current Outpatient Prescriptions  Medication Sig Dispense Refill  . amLODipine-valsartan (EXFORGE) 10-320 MG per tablet TAKE ONE TABLET BY MOUTH DAILY 30 tablet  11  . cetirizine-pseudoephedrine (ZYRTEC-D) 5-120 MG tablet Take 1 tablet by mouth 2 (two) times daily.    . fluticasone (FLONASE) 50 MCG/ACT nasal spray PLACE 2 SPRAYS INTO THE NOSE DAILY. 48 g 3  . HYDROmorphone (DILAUDID) 4 MG tablet Take 1 tablet (4 mg total) by mouth every 6 (six) hours as needed for moderate pain or severe pain. 30 tablet 0  . Multiple Vitamin (MULTIVITAMIN WITH MINERALS) TABS tablet Take 1 tablet by mouth daily.    . ondansetron (ZOFRAN) 4 MG tablet Take 1 tablet (4 mg total) by mouth every 8 (eight) hours as needed for nausea or vomiting. 6 tablet 0  . sertraline (ZOLOFT) 100 MG tablet 1 TABLET BY MOUTH ONCE DAILY (Patient taking differently: 0.5 TABLET BY MOUTH ONCE DAILY) 30 tablet 11  . WAL-ZYR D 5-120 MG per tablet TAKE 1 TABLET BY MOUTH TWICE DAILY 552 tablet 0   No current facility-administered medications for this visit.    Allergies as of 02/11/2016  . (No Known Allergies)    Family History  Problem Relation Age of Onset  . Diabetes    . Hyperlipidemia    .  Hypertension    . Alcohol abuse    . Depression    . Colon cancer Maternal Grandfather     Social History   Social History  . Marital Status: Married    Spouse Name: N/A  . Number of Children: 3  . Years of Education: N/A   Occupational History  . Not on file.   Social History Main Topics  . Smoking status: Never Smoker   . Smokeless tobacco: Never Used  . Alcohol Use: No  . Drug Use: No  . Sexual Activity: Not on file   Other Topics Concern  . Not on file   Social History Narrative     Physical Exam: BP 124/86 mmHg  Pulse 84  Ht  (1.778 m)  Wt 227 lb (102.967 kg)  BMI 32.57 kg/m2 Constitutional: generally well-appearing Psychiatric: alert and oriented x3 Eyes: extraocular movements intact Mouth: oral pharynx moist, no lesions Neck: supple no lymphadenopathy Cardiovascular: heart regular rate and rhythm Lungs: clear to auscultation bilaterally Abdomen: soft,  nontender, nondistended, no obvious ascites, no peritoneal signs, normal bowel sounds Extremities: no lower extremity edema bilaterally Skin: no lesions on visible extremities   Assessment and plan: 45 y.o. male with acute hepatitis, likely due to infectious mononucleosis  EBV viral capsid IgG and IgM were both significantly elevated, this strongly suggests infectious mononucleosis as the cause of his elevated liver tests. About 50% of the time on it was associated with a rise in liver tests. It seems like the liver tests have already peaked and are coming down. He will get a repeat set of labs today including LFTs and also hepatitis B surface antibody which has not been checked yet. There are a few outstanding lab tests including AMA, ceruloplasmin level. I will wait for those also. He has not had the ultrasound yet that was ordered last week and we will work on that to make sure he gets in the next day or 2. He does not seem to have splenomegaly on examination right now. I did explain to him that he should refrain from significant contact sports if he does have splenomegaly confirmed on ultrasound. Fortunately he doesn't really exercise much right now. He understands that it can take weeks to months for liver test to completely normalize. We will follow along.   Rob Bunting, MD [facility] 02/11/2016, 10:30 AM  Cc: Nelwyn Salisbury, MD

## 2016-02-11 NOTE — Progress Notes (Signed)
   Subjective:    Patient ID: Darren Calhoun, male    DOB: 11/27/1971, 45 y.o.   MRN: [id number]  HPI Here with his wife to follow up on elevated transaminases with jaundice, which started about 5 days ago. He was here yesterday with jaundice, anorexia, nausea, and RUQ pain. His labs yesterday (compared to 4 days prior) revealed his AST had jumped from 116 to 1473, his ALT jumped from 86 to 475, and his ammonia was elevated to 46. His CBC was normal except for a slight rise in the monocytes. So far the workup for etiology has shown positive titers for EBV for both IgM and IgG. Today he feels a little better, his appetite has returned somewhat, he is less nauseated, and the RUQ has decreased. No fevers. He is drinking fluids and has eaten a little food. No one else in the family is ill.   Review of Systems  Constitutional: Positive for fatigue. Negative for fever, chills and diaphoresis.  HENT: Negative.   Eyes: Negative.   Respiratory: Negative.   Cardiovascular: Negative.   Gastrointestinal: Positive for nausea and abdominal pain. Negative for vomiting, diarrhea, constipation, blood in stool, abdominal distention, anal bleeding and rectal pain.  Genitourinary: Negative.   Neurological: Negative.        Objective:   Physical Exam  Constitutional: He is oriented to person, place, and time.  He looks better today and the jaundice may be slightly diminished  Neck: No thyromegaly present.  Cardiovascular: Normal rate, regular rhythm, normal heart sounds and intact distal pulses.   Pulmonary/Chest: Effort normal and breath sounds normal.  Abdominal: Soft. Bowel sounds are normal. He exhibits no distension and no mass. There is no rebound and no guarding.  Still tender in the RUQ but this has decreased quite a bit. No HSM  Lymphadenopathy:    He has no cervical adenopathy.  Neurological: He is alert and oriented to person, place, and time.          Assessment & Plan:  Elevated  transaminases with jaundice and RUQ pain, most likely due to acute mononucleosis. I discussed the case over the phone with Dr. Erick BlinksJay Pyrtle of GI, and he agreed this was a likely etiology. However we will continue to workup other possible causes including hepatitis A and B. Darren Calhoun is scheduled to see the GI clinic on Monday. He will continue to rest and drink fluids over the weekend. We have repeat labs including aminases pending from today.

## 2016-02-12 ENCOUNTER — Ambulatory Visit
Admission: RE | Admit: 2016-02-12 | Discharge: 2016-02-12 | Disposition: A | Discharge status: Home / Self Care | Payer: BC Managed Care – PPO | Source: Ambulatory Visit | Attending: Family Medicine | Admitting: Family Medicine

## 2016-02-12 DIAGNOSIS — R7401 Elevation of levels of liver transaminase levels: Secondary | ICD-10-CM

## 2016-02-12 DIAGNOSIS — R740 Nonspecific elevation of levels of transaminase and lactic acid dehydrogenase [LDH]: Principal | ICD-10-CM

## 2016-02-12 LAB — ANTI-SMOOTH MUSCLE ANTIBODY, IGG: Smooth Muscle Ab: <20 U (ref <20)

## 2016-02-15 ENCOUNTER — Other Ambulatory Visit: Payer: Self-pay | Admitting: Family Medicine

## 2016-02-15 ENCOUNTER — Other Ambulatory Visit (INDEPENDENT_AMBULATORY_CARE_PROVIDER_SITE_OTHER): Payer: BC Managed Care – PPO

## 2016-02-15 ENCOUNTER — Telehealth: Payer: Self-pay | Admitting: Gastroenterology

## 2016-02-15 ENCOUNTER — Other Ambulatory Visit: Payer: Self-pay | Admitting: *Deleted

## 2016-02-15 DIAGNOSIS — B179 Acute viral hepatitis, unspecified: Secondary | ICD-10-CM

## 2016-02-15 DIAGNOSIS — K7200 Acute and subacute hepatic failure without coma: Secondary | ICD-10-CM | POA: Diagnosis not present

## 2016-02-15 LAB — HEPATIC FUNCTION PANEL
ALT: 185 U/L — AB (ref 0–53)
AST: 34 U/L (ref 0–37)
Albumin: 4.3 g/dL (ref 3.5–5.2)
Alkaline Phosphatase: 72 U/L (ref 39–117)
BILIRUBIN DIRECT: 0.1 mg/dL (ref 0.0–0.3)
BILIRUBIN TOTAL: 0.5 mg/dL (ref 0.2–1.2)
TOTAL PROTEIN: 7.4 g/dL (ref 6.0–8.3)

## 2016-02-18 ENCOUNTER — Other Ambulatory Visit: Payer: Self-pay

## 2016-02-18 DIAGNOSIS — R7989 Other specified abnormal findings of blood chemistry: Secondary | ICD-10-CM

## 2016-02-18 DIAGNOSIS — R945 Abnormal results of liver function studies: Principal | ICD-10-CM

## 2016-02-20 ENCOUNTER — Ambulatory Visit: Payer: BC Managed Care – PPO | Admitting: Physician Assistant

## 2016-02-20 NOTE — Telephone Encounter (Signed)
CMA returned called. See Lab Result note on 02-18-16.

## 2016-02-21 ENCOUNTER — Other Ambulatory Visit: Payer: Self-pay | Admitting: Family Medicine

## 2016-02-29 ENCOUNTER — Other Ambulatory Visit (INDEPENDENT_AMBULATORY_CARE_PROVIDER_SITE_OTHER): Payer: BC Managed Care – PPO

## 2016-02-29 DIAGNOSIS — R7989 Other specified abnormal findings of blood chemistry: Secondary | ICD-10-CM | POA: Diagnosis not present

## 2016-02-29 DIAGNOSIS — R945 Abnormal results of liver function studies: Principal | ICD-10-CM

## 2016-02-29 LAB — HEPATIC FUNCTION PANEL
ALBUMIN: 4.3 g/dL (ref 3.5–5.2)
ALK PHOS: 51 U/L (ref 39–117)
ALT: 73 U/L — ABNORMAL HIGH (ref 0–53)
AST: 38 U/L — ABNORMAL HIGH (ref 0–37)
Bilirubin, Direct: 0.1 mg/dL (ref 0.0–0.3)
TOTAL PROTEIN: 7.6 g/dL (ref 6.0–8.3)
Total Bilirubin: 0.3 mg/dL (ref 0.2–1.2)

## 2016-03-04 ENCOUNTER — Other Ambulatory Visit: Payer: Self-pay

## 2016-03-04 DIAGNOSIS — R7989 Other specified abnormal findings of blood chemistry: Secondary | ICD-10-CM

## 2016-03-04 DIAGNOSIS — R945 Abnormal results of liver function studies: Principal | ICD-10-CM

## 2016-03-28 ENCOUNTER — Ambulatory Visit: Payer: BC Managed Care – PPO | Admitting: Gastroenterology

## 2016-04-17 DIAGNOSIS — Z7689 Persons encountering health services in other specified circumstances: Secondary | ICD-10-CM

## 2016-04-23 ENCOUNTER — Other Ambulatory Visit (INDEPENDENT_AMBULATORY_CARE_PROVIDER_SITE_OTHER): Payer: BC Managed Care – PPO

## 2016-04-23 DIAGNOSIS — R7989 Other specified abnormal findings of blood chemistry: Secondary | ICD-10-CM

## 2016-04-23 DIAGNOSIS — R945 Abnormal results of liver function studies: Principal | ICD-10-CM

## 2016-04-23 LAB — HEPATIC FUNCTION PANEL
ALBUMIN: 4.6 g/dL (ref 3.5–5.2)
ALT: 56 U/L — AB (ref 0–53)
AST: 30 U/L (ref 0–37)
Alkaline Phosphatase: 48 U/L (ref 39–117)
BILIRUBIN DIRECT: 0.1 mg/dL (ref 0.0–0.3)
TOTAL PROTEIN: 7.7 g/dL (ref 6.0–8.3)
Total Bilirubin: 0.5 mg/dL (ref 0.2–1.2)

## 2016-04-24 ENCOUNTER — Other Ambulatory Visit: Payer: Self-pay

## 2016-04-24 DIAGNOSIS — R945 Abnormal results of liver function studies: Principal | ICD-10-CM

## 2016-04-24 DIAGNOSIS — R7989 Other specified abnormal findings of blood chemistry: Secondary | ICD-10-CM

## 2016-06-11 ENCOUNTER — Ambulatory Visit (INDEPENDENT_AMBULATORY_CARE_PROVIDER_SITE_OTHER): Payer: BC Managed Care – PPO | Admitting: Family Medicine

## 2016-06-11 ENCOUNTER — Encounter: Payer: Self-pay | Admitting: Family Medicine

## 2016-06-11 VITALS — BP 132/88 | HR 88 | Temp 97.8°F | Ht 70.0 in | Wt 231.0 lb

## 2016-06-11 DIAGNOSIS — W57XXXA Bitten or stung by nonvenomous insect and other nonvenomous arthropods, initial encounter: Secondary | ICD-10-CM | POA: Diagnosis not present

## 2016-06-11 DIAGNOSIS — T148 Other injury of unspecified body region: Secondary | ICD-10-CM

## 2016-06-11 NOTE — Progress Notes (Signed)
   Subjective:    Patient ID: Darren Calhoun, male    DOB: 10/03/1971, 45 y.o.   MRN: [id number]  HPI Here to check a tick bite in the right groin which he discovered 3 days ago. He pulled this out with tweezers. He feels fine in general but there is a red area around the site.    Review of Systems  Constitutional: Negative.   Respiratory: Negative.   Cardiovascular: Negative.   Skin: Positive for rash.  Neurological: Negative.        Objective:   Physical Exam  Constitutional: He is oriented to person, place, and time. He appears well-developed and well-nourished.  Cardiovascular: Normal rate, regular rhythm, normal heart sounds and intact distal pulses.   Pulmonary/Chest: Effort normal and breath sounds normal.  Neurological: He is alert and oriented to person, place, and time.  Skin:  There is a small area of macular erythema in the right groin.           Assessment & Plan:  Tick bite. He has some local skin irritation but he seems to be well otherwise. Recheck prn.  Nelwyn SalisburyFRY,Latria Mccarron A, MD

## 2016-06-11 NOTE — Progress Notes (Signed)
Pre visit review using our clinic review tool, if applicable. No additional management support is needed unless otherwise documented below in the visit note. 

## 2016-07-11 ENCOUNTER — Other Ambulatory Visit: Payer: BC Managed Care – PPO | Admitting: Family Medicine

## 2016-07-11 ENCOUNTER — Other Ambulatory Visit (INDEPENDENT_AMBULATORY_CARE_PROVIDER_SITE_OTHER): Payer: BC Managed Care – PPO

## 2016-07-11 DIAGNOSIS — Z0000 Encounter for general adult medical examination without abnormal findings: Secondary | ICD-10-CM

## 2016-07-11 DIAGNOSIS — R7989 Other specified abnormal findings of blood chemistry: Secondary | ICD-10-CM | POA: Diagnosis not present

## 2016-07-11 LAB — HEPATIC FUNCTION PANEL
ALT: 47 U/L (ref 0–53)
AST: 22 U/L (ref 0–37)
Albumin: 4.3 g/dL (ref 3.5–5.2)
Alkaline Phosphatase: 46 U/L (ref 39–117)
BILIRUBIN DIRECT: 0.1 mg/dL (ref 0.0–0.3)
BILIRUBIN TOTAL: 0.4 mg/dL (ref 0.2–1.2)
Total Protein: 7.3 g/dL (ref 6.0–8.3)

## 2016-07-11 LAB — LIPID PANEL
CHOL/HDL RATIO: 6
Cholesterol: 193 mg/dL (ref 0–200)
HDL: 32.8 mg/dL — AB (ref >39.00)
NONHDL: 160.65
TRIGLYCERIDES: 266 mg/dL — AB (ref 0.0–149.0)
VLDL: 53.2 mg/dL — ABNORMAL HIGH (ref 0.0–40.0)

## 2016-07-11 LAB — BASIC METABOLIC PANEL
BUN: 15 mg/dL (ref 6–23)
CALCIUM: 9.5 mg/dL (ref 8.4–10.5)
CO2: 28 mEq/L (ref 19–32)
CREATININE: 1.18 mg/dL (ref 0.40–1.50)
Chloride: 102 mEq/L (ref 96–112)
GFR: 71.45 mL/min (ref >60.00)
GLUCOSE: 128 mg/dL — AB (ref 70–99)
Potassium: 4.5 mEq/L (ref 3.5–5.1)
Sodium: 138 mEq/L (ref 135–145)

## 2016-07-11 LAB — CBC WITH DIFFERENTIAL/PLATELET
BASOS PCT: 0.9 % (ref 0.0–3.0)
Basophils Absolute: 0.1 10*3/uL (ref 0.0–0.1)
EOS ABS: 0.3 10*3/uL (ref 0.0–0.7)
Eosinophils Relative: 3.6 % (ref 0.0–5.0)
HCT: 44.6 % (ref 39.0–52.0)
Hemoglobin: 15.4 g/dL (ref 13.0–17.0)
Lymphocytes Relative: 33.4 % (ref 12.0–46.0)
Lymphs Abs: 2.8 10*3/uL (ref 0.7–4.0)
MCHC: 34.4 g/dL (ref 30.0–36.0)
MCV: 87.7 fl (ref 78.0–100.0)
MONO ABS: 0.9 10*3/uL (ref 0.1–1.0)
Monocytes Relative: 10.3 % (ref 3.0–12.0)
NEUTROS ABS: 4.3 10*3/uL (ref 1.4–7.7)
Neutrophils Relative %: 51.8 % (ref 43.0–77.0)
PLATELETS: 293 10*3/uL (ref 150.0–400.0)
RBC: 5.09 Mil/uL (ref 4.22–5.81)
RDW: 13.3 % (ref 11.5–15.5)
WBC: 8.4 10*3/uL (ref 4.0–10.5)

## 2016-07-11 LAB — POC URINALSYSI DIPSTICK (AUTOMATED)
BILIRUBIN UA: NEGATIVE
GLUCOSE UA: NEGATIVE
KETONES UA: NEGATIVE
Leukocytes, UA: NEGATIVE
Nitrite, UA: NEGATIVE
RBC UA: NEGATIVE
Urobilinogen, UA: 0.2
pH, UA: 5.5

## 2016-07-11 LAB — LDL CHOLESTEROL, DIRECT: Direct LDL: 120 mg/dL

## 2016-07-11 LAB — TSH: TSH: 3.42 u[IU]/mL (ref 0.35–4.50)

## 2016-07-18 ENCOUNTER — Ambulatory Visit (INDEPENDENT_AMBULATORY_CARE_PROVIDER_SITE_OTHER): Payer: BC Managed Care – PPO | Admitting: Family Medicine

## 2016-07-18 ENCOUNTER — Encounter: Payer: Self-pay | Admitting: Family Medicine

## 2016-07-18 VITALS — BP 100/73 | HR 84 | Temp 98.4°F | Ht 70.0 in | Wt 229.0 lb

## 2016-07-18 DIAGNOSIS — E119 Type 2 diabetes mellitus without complications: Secondary | ICD-10-CM | POA: Diagnosis not present

## 2016-07-18 DIAGNOSIS — Z0000 Encounter for general adult medical examination without abnormal findings: Secondary | ICD-10-CM

## 2016-07-18 LAB — HEMOGLOBIN A1C: Hgb A1c MFr Bld: 6.6 % — ABNORMAL HIGH (ref 4.6–6.5)

## 2016-07-18 NOTE — Progress Notes (Signed)
   Subjective:    Patient ID: Darren Calhoun, male    DOB: 05/02/1971, 45 y.o.   MRN: [id number]  HPI 45 yr old male for a well exam. He feels well.    Review of Systems  Constitutional: Negative.   HENT: Negative.   Eyes: Negative.   Respiratory: Negative.   Cardiovascular: Negative.   Gastrointestinal: Negative.   Genitourinary: Negative.   Musculoskeletal: Negative.   Skin: Negative.   Neurological: Negative.   Psychiatric/Behavioral: Negative.        Objective:   Physical Exam  Constitutional: He is oriented to person, place, and time. He appears well-developed and well-nourished. No distress.  HENT:  Head: Normocephalic and atraumatic.  Right Ear: External ear normal.  Left Ear: External ear normal.  Nose: Nose normal.  Mouth/Throat: Oropharynx is clear and moist. No oropharyngeal exudate.  Eyes: Conjunctivae and EOM are normal. Pupils are equal, round, and reactive to light. Right eye exhibits no discharge. Left eye exhibits no discharge. No scleral icterus.  Neck: Neck supple. No JVD present. No tracheal deviation present. No thyromegaly present.  Cardiovascular: Normal rate, regular rhythm, normal heart sounds and intact distal pulses.  Exam reveals no gallop and no friction rub.   No murmur heard. Pulmonary/Chest: Effort normal and breath sounds normal. No respiratory distress. He has no wheezes. He has no rales. He exhibits no tenderness.  Abdominal: Soft. Bowel sounds are normal. He exhibits no distension and no mass. There is no tenderness. There is no rebound and no guarding.  Genitourinary: Rectum normal, prostate normal and penis normal. Rectal exam shows guaiac negative stool. No penile tenderness.  Musculoskeletal: Normal range of motion. He exhibits no edema or tenderness.  Lymphadenopathy:    He has no cervical adenopathy.  Neurological: He is alert and oriented to person, place, and time. He has normal reflexes. No cranial nerve deficit. He exhibits  normal muscle tone. Coordination normal.  Skin: Skin is warm and dry. No rash noted. He is not diaphoretic. No erythema. No pallor.  Psychiatric: He has a normal mood and affect. His behavior is normal. Judgment and thought content normal.          Assessment & Plan:  Well exam. We discussed diet and exercise. Get his first A1c today.  Nelwyn SalisburyFRY,STEPHEN A, MD

## 2016-07-18 NOTE — Progress Notes (Signed)
Pre visit review using our clinic review tool, if applicable. No additional management support is needed unless otherwise documented below in the visit note. 

## 2016-10-15 ENCOUNTER — Ambulatory Visit (INDEPENDENT_AMBULATORY_CARE_PROVIDER_SITE_OTHER): Payer: BC Managed Care – PPO | Admitting: Family Medicine

## 2016-10-15 ENCOUNTER — Encounter: Payer: Self-pay | Admitting: Family Medicine

## 2016-10-15 VITALS — BP 130/86 | Temp 97.9°F | Ht 70.0 in | Wt 228.0 lb

## 2016-10-15 DIAGNOSIS — J019 Acute sinusitis, unspecified: Secondary | ICD-10-CM | POA: Diagnosis not present

## 2016-10-15 MED ORDER — AMOXICILLIN-POT CLAVULANATE 875-125 MG PO TABS: 1.0000 | ORAL_TABLET | Freq: Two times a day (BID) | ORAL | 0 refills | Status: DC

## 2016-10-15 NOTE — Progress Notes (Signed)
Pre visit review using our clinic review tool, if applicable. No additional management support is needed unless otherwise documented below in the visit note. 

## 2016-10-15 NOTE — Progress Notes (Signed)
   Subjective:    Patient ID: Darren Calhoun, male    DOB: 04/14/1971, 45 y.o.   MRN: [id number]  HPI Here for one week of sinus pressure, PND, ST and coughing up green sputum.    Review of Systems  Constitutional: Negative.   HENT: Positive for congestion, postnasal drip, sinus pain, sinus pressure and sore throat.   Eyes: Negative.   Respiratory: Positive for cough.        Objective:   Physical Exam  Constitutional: He appears well-developed and well-nourished.  HENT:  Right Ear: External ear normal.  Left Ear: External ear normal.  Nose: Nose normal.  Mouth/Throat: Oropharynx is clear and moist.  Eyes: Conjunctivae are normal.  Pulmonary/Chest: Effort normal and breath sounds normal.  Lymphadenopathy:    He has no cervical adenopathy.          Assessment & Plan:  Sinusitis, treat with Augmentin.  Nelwyn SalisburyFRY,STEPHEN A, MD

## 2016-11-26 ENCOUNTER — Encounter: Payer: Self-pay | Admitting: Family Medicine

## 2016-11-26 ENCOUNTER — Ambulatory Visit (INDEPENDENT_AMBULATORY_CARE_PROVIDER_SITE_OTHER): Payer: BC Managed Care – PPO | Admitting: Family Medicine

## 2016-11-26 VITALS — BP 142/90 | HR 104 | Temp 98.3°F | Ht 70.0 in | Wt 226.0 lb

## 2016-11-26 DIAGNOSIS — J324 Chronic pansinusitis: Secondary | ICD-10-CM

## 2016-11-26 MED ORDER — CEFUROXIME AXETIL 500 MG PO TABS: 500.0000 mg | ORAL_TABLET | Freq: Two times a day (BID) | ORAL | 0 refills | Status: DC

## 2016-11-26 NOTE — Progress Notes (Signed)
Subjective:     Patient ID: Darren Calhoun, male   DOB: 05/11/1971, 45 y.o.   MRN: [id number]  HPI Acute visit for three-week history of sinusitis symptoms. He's had long history of recurrent sinusitis. He describes some pansinusitis pain over frontal and maxillary sinuses. Greenish nasal discharge for 3 weeks. Intermittent headaches and malaise. Dry cough. He's tried saline irrigation without improvement. Was treated with course of Augmentin in early November and seemed to improve but is not sure he ever completely improved  Past Medical History:  Diagnosis Date  . Allergy   . Anxiety   . Headache(784.0)   . Hypertension    Past Surgical History:  Procedure Laterality Date  . KNEE ARTHROSCOPY     right  . wisdom teeth extracted      reports that he has never smoked. He has never used smokeless tobacco. He reports that he does not drink alcohol or use drugs. family history includes Colon cancer in his maternal grandfather. No Known Allergies   Review of Systems  Constitutional: Positive for fatigue.  HENT: Positive for congestion and sinus pressure.   Respiratory: Positive for cough.   Neurological: Positive for headaches.       Objective:   Physical Exam  Constitutional: He appears well-developed and well-nourished.  HENT:  Right Ear: External ear normal.  Left Ear: External ear normal.  Nose: Nose normal.  Mouth/Throat: Oropharynx is clear and moist. No oropharyngeal exudate.  Neck: Neck supple. No thyromegaly present.  Cardiovascular: Normal rate and regular rhythm.   Pulmonary/Chest: Effort normal and breath sounds normal. No respiratory distress. He has no wheezes. He has no rales.  Lymphadenopathy:    He has no cervical adenopathy.       Assessment:     Probable acute versus subacute pansinusitis, recurrent    Plan:     -Discussed prevention with adequate hydration, consider daily nasal saline irrigation, control of allergies -Ceftin 500 mg twice a day  for 10 days. He has responded well to this in the past. -Consider limited CT of sinuses if not improved following this treatment  Kristian CoveyBruce W Jedrick Hutcherson MD [facility]

## 2016-11-26 NOTE — Patient Instructions (Signed)

## 2016-11-26 NOTE — Progress Notes (Signed)
Pre visit review using our clinic review tool, if applicable. No additional management support is needed unless otherwise documented below in the visit note. 

## 2017-01-13 ENCOUNTER — Other Ambulatory Visit: Payer: Self-pay | Admitting: Family Medicine

## 2017-01-27 ENCOUNTER — Other Ambulatory Visit (INDEPENDENT_AMBULATORY_CARE_PROVIDER_SITE_OTHER): Payer: Self-pay

## 2017-01-27 DIAGNOSIS — E119 Type 2 diabetes mellitus without complications: Secondary | ICD-10-CM

## 2017-01-28 LAB — HEMOGLOBIN A1C: HEMOGLOBIN A1C: 6.3 % (ref 4.6–6.5)

## 2017-01-29 ENCOUNTER — Other Ambulatory Visit: Payer: BC Managed Care – PPO

## 2017-01-30 ENCOUNTER — Encounter: Payer: Self-pay | Admitting: Family Medicine

## 2017-01-30 ENCOUNTER — Ambulatory Visit (INDEPENDENT_AMBULATORY_CARE_PROVIDER_SITE_OTHER): Payer: Self-pay | Admitting: Family Medicine

## 2017-01-30 VITALS — BP 132/80 | Temp 98.1°F | Ht 70.0 in | Wt 226.6 lb

## 2017-01-30 DIAGNOSIS — S139XXA Sprain of joints and ligaments of unspecified parts of neck, initial encounter: Secondary | ICD-10-CM
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
## 2017-02-10 ENCOUNTER — Ambulatory Visit (INDEPENDENT_AMBULATORY_CARE_PROVIDER_SITE_OTHER): Payer: Self-pay | Admitting: Family Medicine

## 2017-02-10 ENCOUNTER — Encounter: Payer: Self-pay | Admitting: Family Medicine

## 2017-02-10 VITALS — BP 130/88 | Temp 98.2°F | Ht 70.0 in | Wt 228.0 lb

## 2017-02-10 DIAGNOSIS — J0141 Acute recurrent pansinusitis: Secondary | ICD-10-CM

## 2017-02-10 IMAGING — CT CT RENAL STONE PROTOCOL
2 of 4 series · 16 of 46 positions shown, 18 images · non-contrast
Comparison: None.

CLINICAL DATA: Acute onset of generalized abdominal pain radiating
to the back. Dysuria and lack of bowel movements for 1 day. Initial
encounter.

EXAM:
CT ABDOMEN AND PELVIS WITHOUT CONTRAST
TECHNIQUE: Multidetector CT imaging of the abdomen and pelvis was performed
following the standard protocol without IV contrast.

[Series 2: standard/full over (age)lbs 5.0 · axial · 0.71mm/px · z∈[+981,+1451]mm · 13 of 104 slices shown, 15 images]
[im 5/104  soft-tissue]
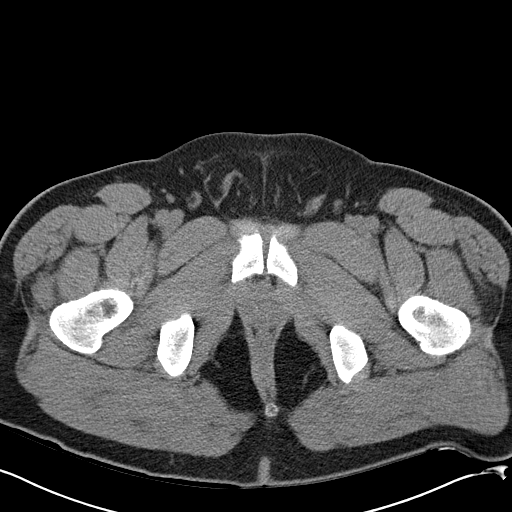
[im 5/104  bone]
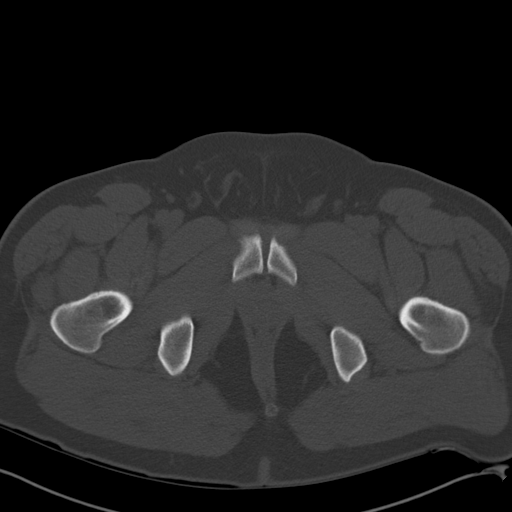
[im 14/104  soft-tissue]
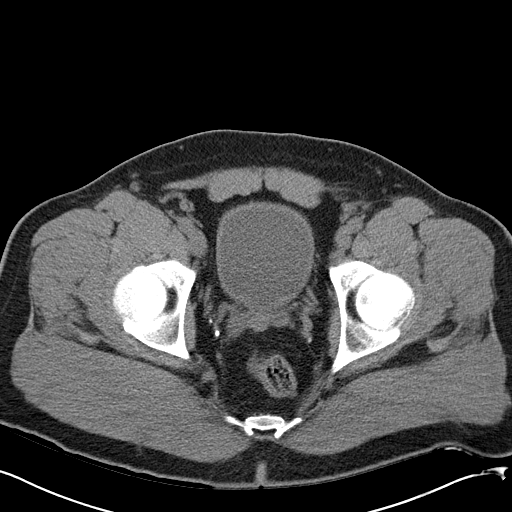
[im 23/104  soft-tissue]
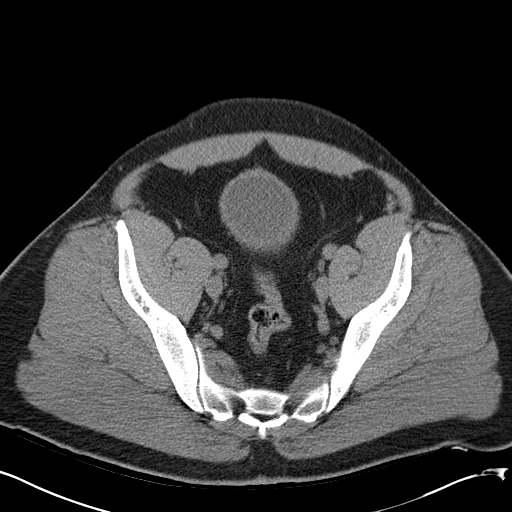
[im 27/104  soft-tissue]
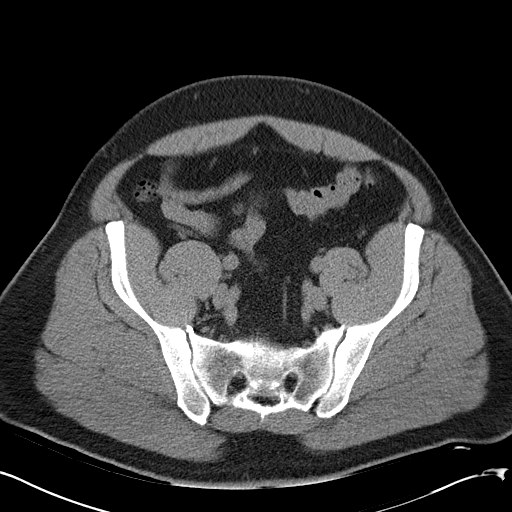
[im 36/104  soft-tissue]
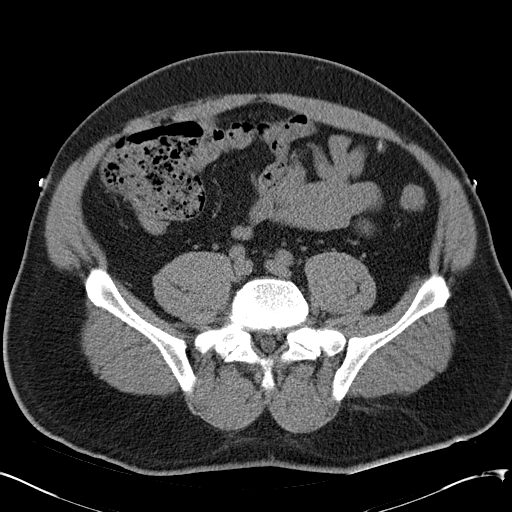
[im 45/104  soft-tissue]
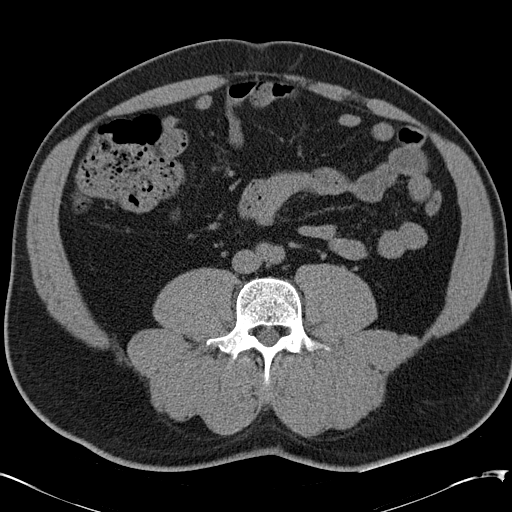
[im 54/104  soft-tissue]
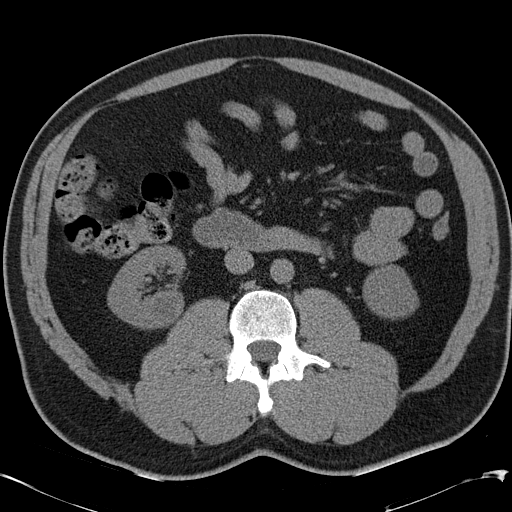
[im 59/104  soft-tissue]
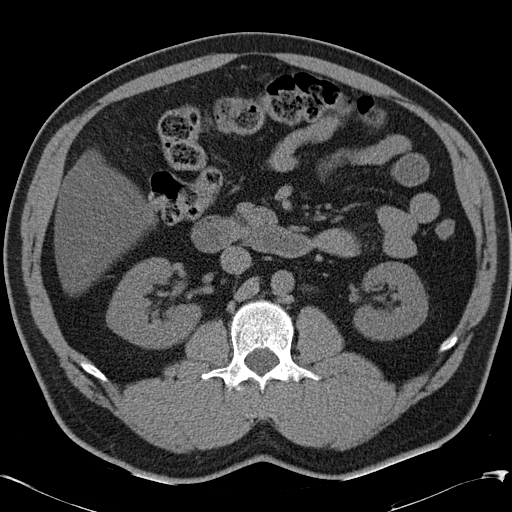
[im 68/104  soft-tissue]
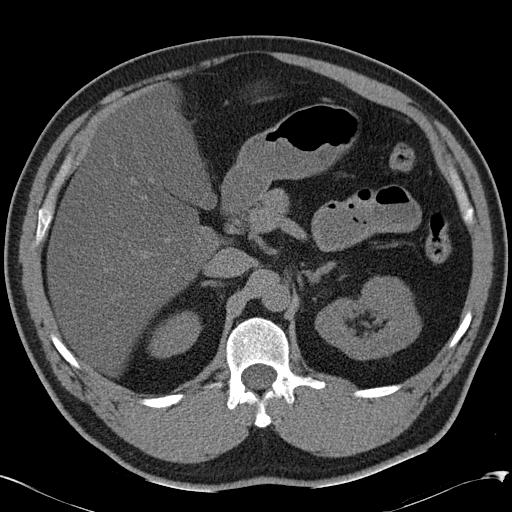
[im 68/104  bone]
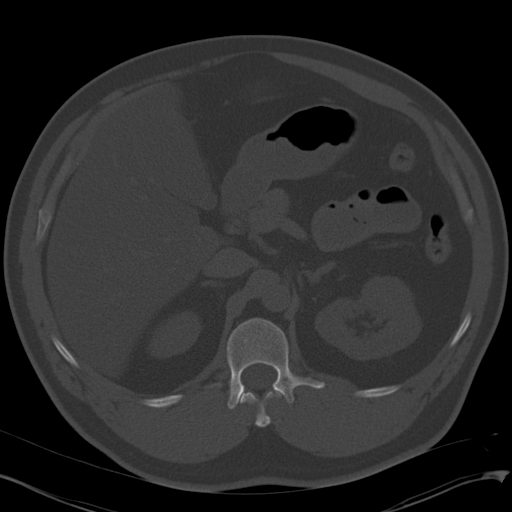
[im 77/104  soft-tissue]
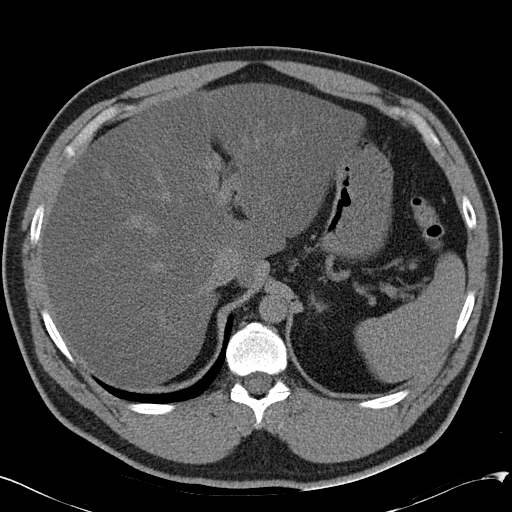
[im 81/104  soft-tissue]
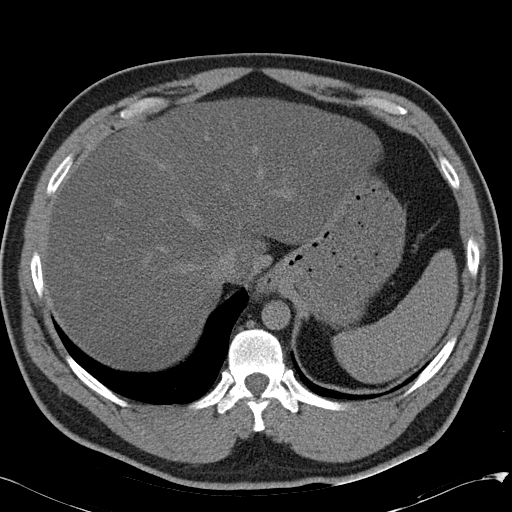
[im 90/104  soft-tissue]
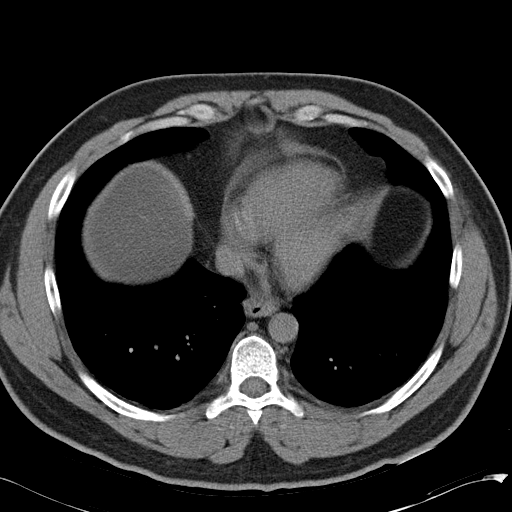
[im 99/104  soft-tissue]
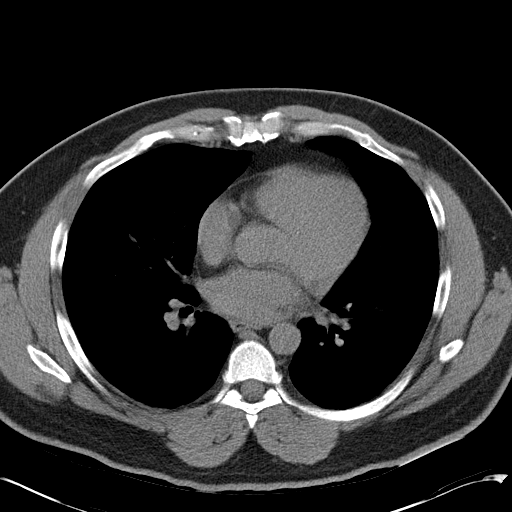

[Series 3: mpr coronal · coronal · 0.78mm/px · 3 of 120 slices shown]
[im 40/120  soft-tissue]
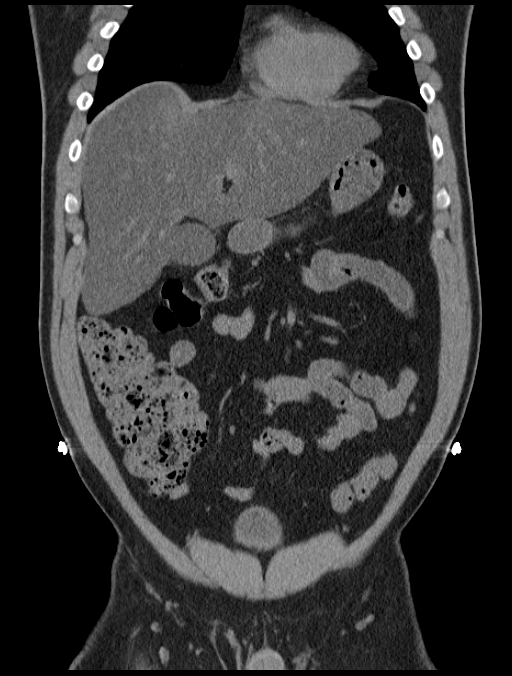
[im 53/120  soft-tissue]
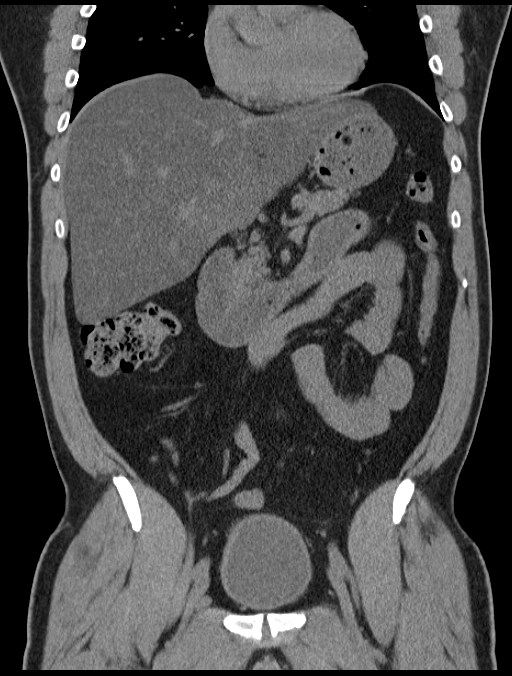
[im 67/120  soft-tissue]
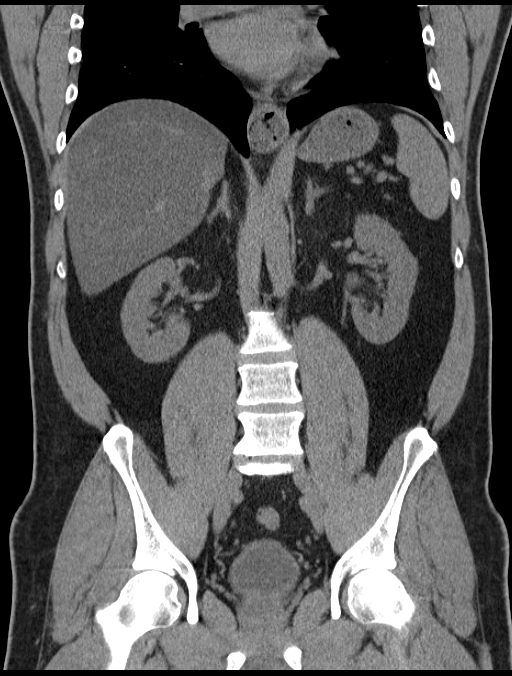

[16 of 46 positions shown; findings below may reference images not displayed]

FINDINGS: The visualized lung bases are clear.

There is diffuse fatty infiltration within the liver, with mild
sparing about the gallbladder fossa. The spleen is unremarkable in
appearance. The gallbladder is within normal limits. The pancreas
and adrenal glands are unremarkable.

A nonobstructing 5 mm stone is noted near the lower pole of the
right kidney. There is suggestion of a 2.1 cm right renal cyst. The
kidneys are otherwise unremarkable. There is no evidence of
hydronephrosis. No obstructing ureteral stones are identified. No
perinephric stranding is seen.

No free fluid is identified. The small bowel is unremarkable in
appearance. The stomach is within normal limits. No acute vascular
abnormalities are seen.

The appendix is normal in caliber, without evidence of appendicitis.
The colon is unremarkable in appearance.

The bladder is mildly distended and grossly unremarkable. A small
urachal remnant is incidentally seen. The prostate is borderline
normal in size. No inguinal lymphadenopathy is seen.

No acute osseous abnormalities are identified. Vacuum phenomenon is
noted at L5-S1.
IMPRESSION: 1. No acute abnormality seen within the abdomen or pelvis.
2. Nonobstructing 5 mm stone noted near the lower pole of the right
kidney. Suggestion of 2.1 cm right renal cyst.
3. Diffuse fatty infiltration within the liver.

## 2017-02-10 MED ORDER — CEFUROXIME AXETIL 500 MG PO TABS: 500.0000 mg | ORAL_TABLET | Freq: Two times a day (BID) | ORAL | 0 refills | Status: DC

## 2017-02-10 NOTE — Progress Notes (Signed)
   Subjective:    Patient ID: Darren Calhoun, male    DOB: 11/23/1971, 46 y.o.   MRN: [id number]  HPI Here with typical sinusitis symptoms of stuffy head, headache, PND, and blowing green mucus from the nose. No cough. He took a course of Augmentin for a sinusitis in AlexisNovemeber and then a course of Ceftin in December.    Review of Systems  Constitutional: Negative.   HENT: Positive for congestion, postnasal drip, sinus pain and sinus pressure. Negative for sore throat.   Eyes: Negative.   Respiratory: Negative.        Objective:   Physical Exam  Constitutional: He appears well-developed and well-nourished.  HENT:  Right Ear: External ear normal.  Left Ear: External ear normal.  Nose: Nose normal.  Mouth/Throat: Oropharynx is clear and moist.  Eyes: Conjunctivae are normal.  Neck: No thyromegaly present.  Pulmonary/Chest: Effort normal and breath sounds normal.  Lymphadenopathy:    He has no cervical adenopathy.          Assessment & Plan:  Sinusitis, treat with Ceftin and Mucinex.  Gershon CraneStephen Fry, MD

## 2017-02-10 NOTE — Patient Instructions (Signed)
WE NOW OFFER   [facility]ren Calhoun[facility] FAST TRACK!!!  SAME DAY Appointments for ACUTE CARE  Such as: Sprains, Injuries, cuts, abrasions, rashes, muscle pain, joint pain, back pain Colds, flu, sore throats, headache, allergies, cough, fever  Ear pain, sinus and eye infections Abdominal pain, nausea, vomiting, diarrhea, upset stomach Animal/insect bites  3 Easy Ways to Schedule: Walk-In Scheduling Call in scheduling Mychart Sign-up: [email or web address]         

## 2017-02-10 NOTE — Progress Notes (Signed)
Pre visit review using our clinic review tool, if applicable. No additional management support is needed unless otherwise documented below in the visit note. 

## 2017-02-19 IMAGING — US US ABDOMEN COMPLETE
1 series · 14 of 25 positions shown · non-contrast
Comparison: CT 02/03/2016.

CLINICAL DATA: Elevated liver function test.

EXAM:
ABDOMEN ULTRASOUND COMPLETE

[Series 1: us abdomen complete · 0.24mm/px · 14 of 86 slices shown]
[im 1/86]
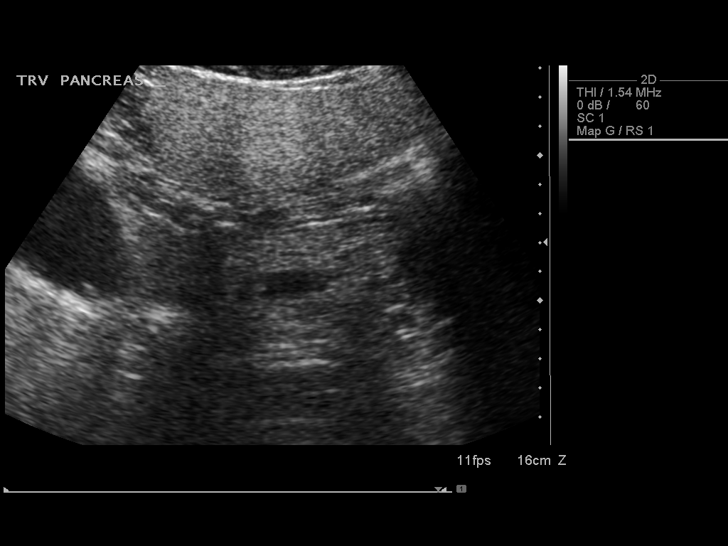
[im 8/86]
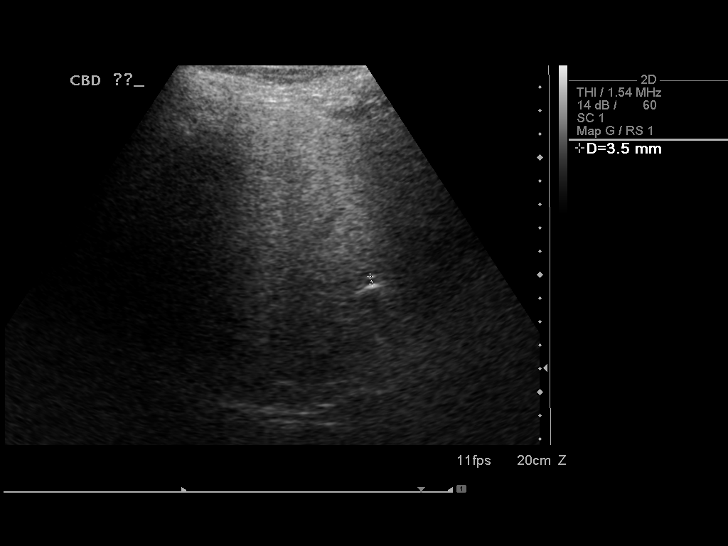
[im 15/86]
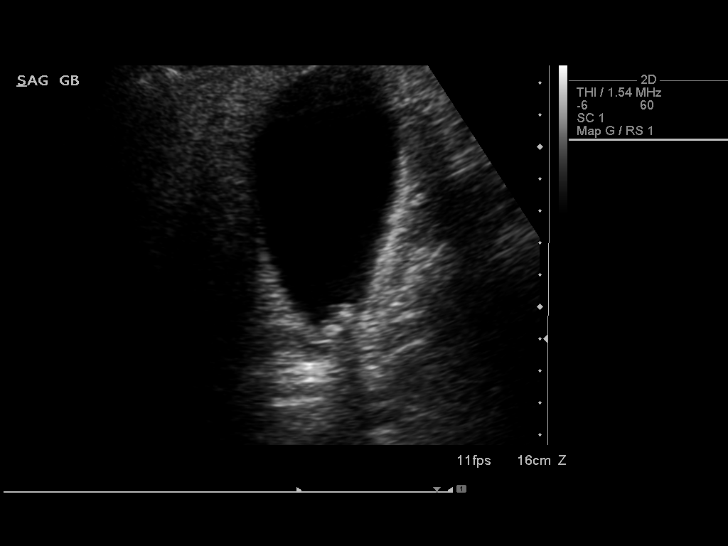
[im 22/86]
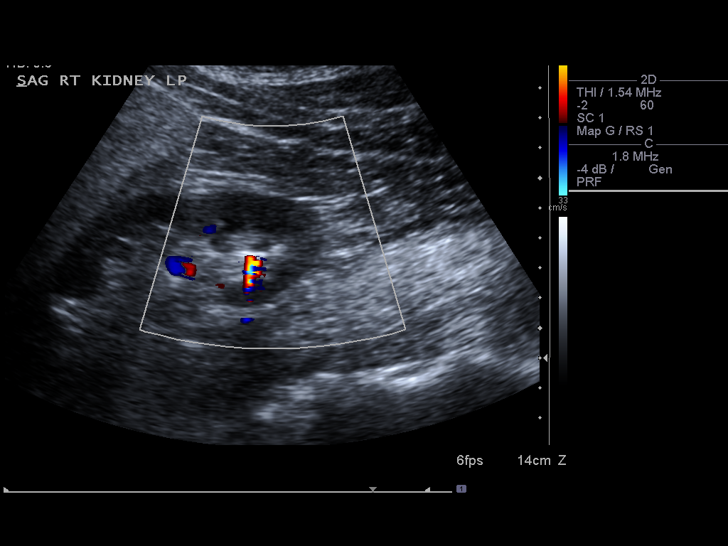
[im 29/86]
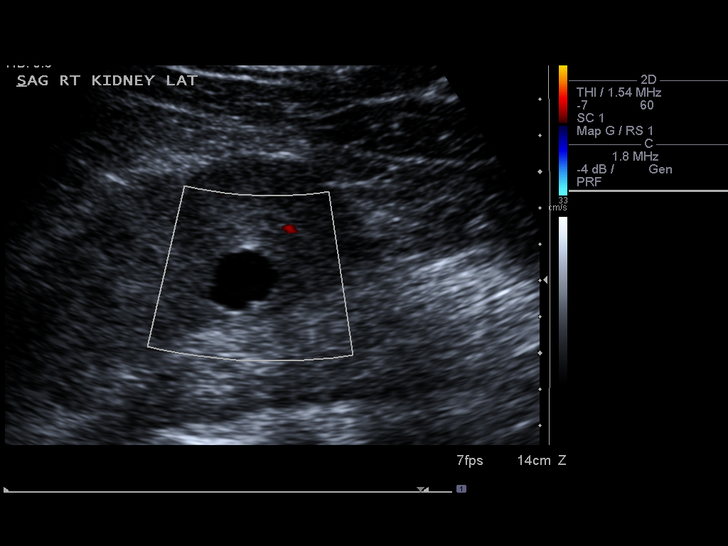
[im 32/86]
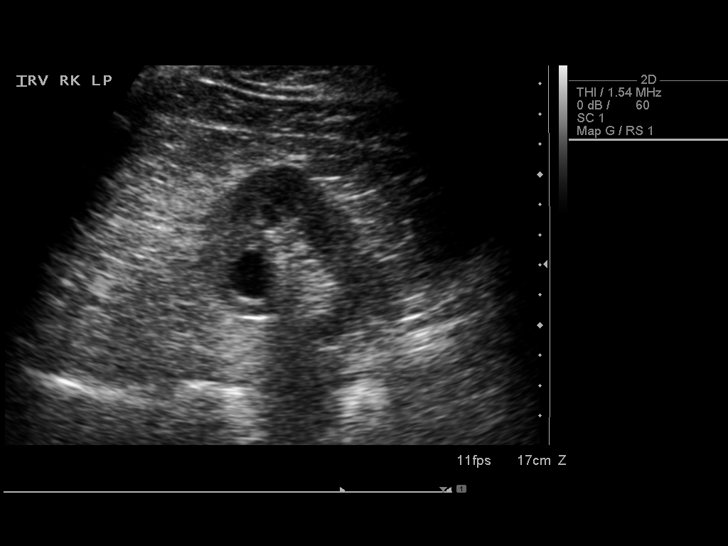
[im 39/86]
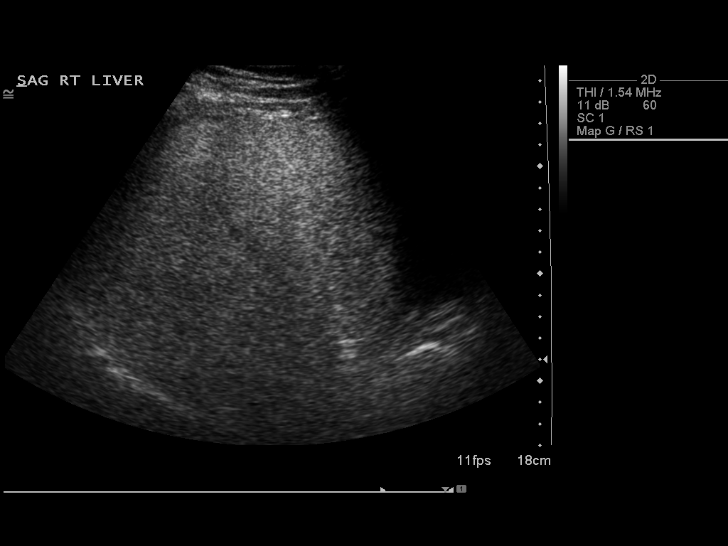
[im 47/86]
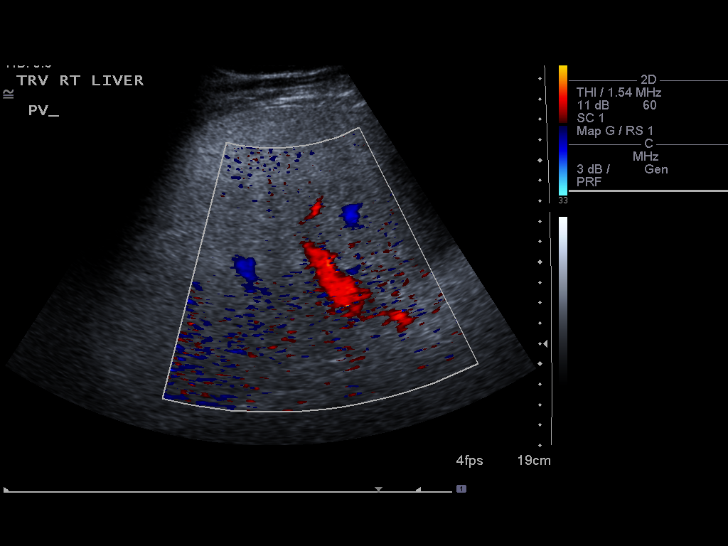
[im 54/86]
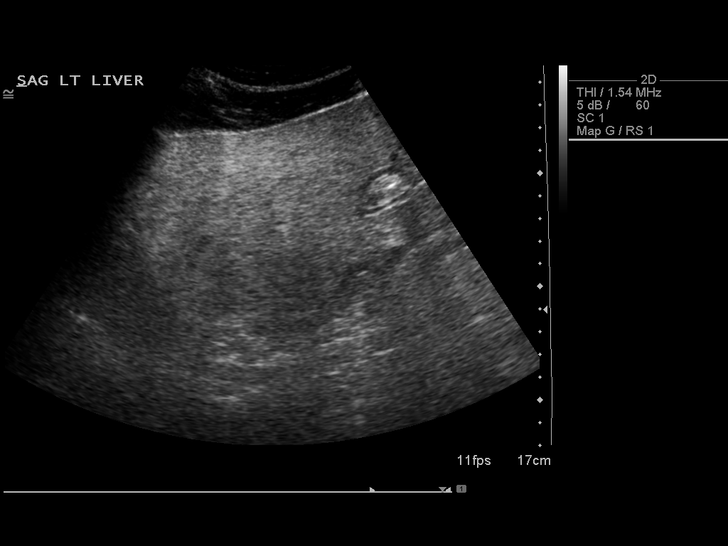
[im 57/86]
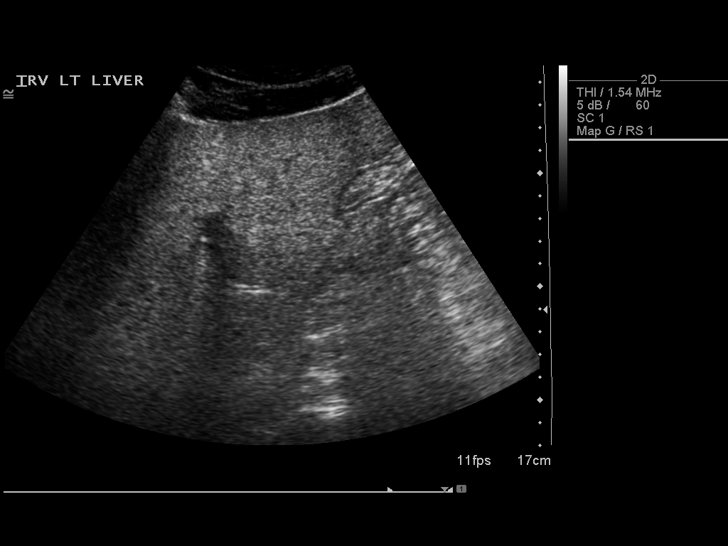
[im 64/86]
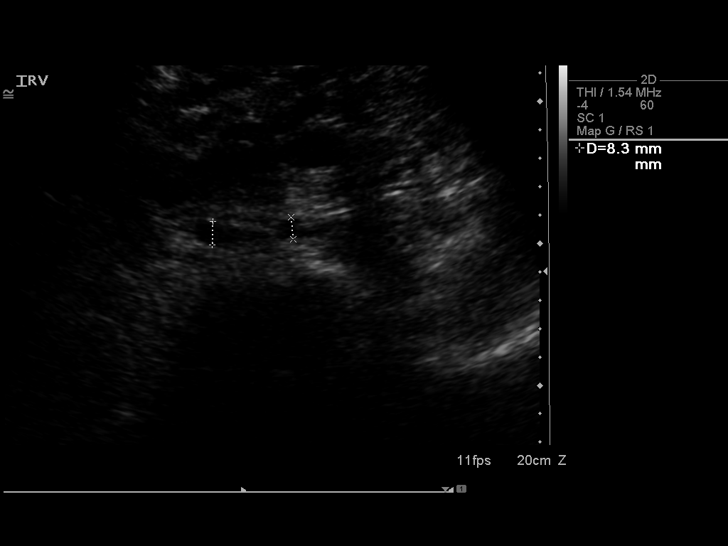
[im 71/86]
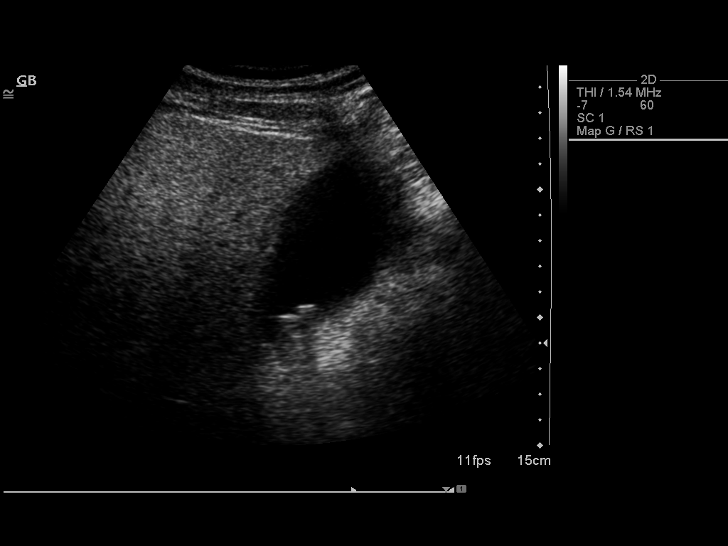
[im 78/86]
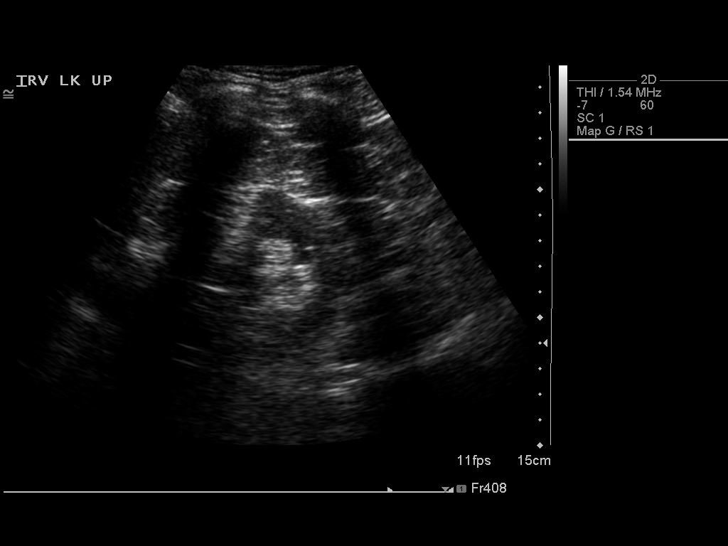
[im 86/86]
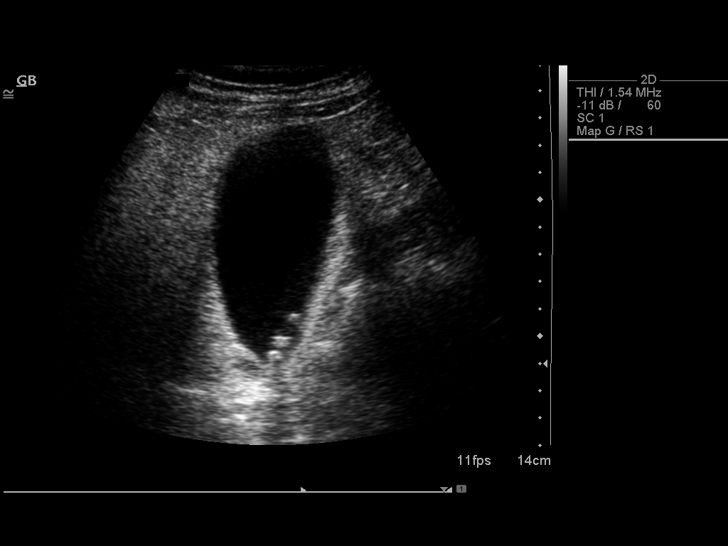

[14 of 25 positions shown; findings below may reference images not displayed]

FINDINGS: Gallbladder: Multiple gallstones. The smallest measures 6 mm.
Gallbladder wall thickness 2.6 mm. Negative Murphy sign.

Common bile duct: Diameter: 3.5 mm

Liver: Heterogeneous echotexture consistent fatty infiltration
and/or hepatocellular disease.

IVC: No abnormality visualized.

Pancreas: Visualized portion unremarkable.

Spleen: Size and appearance within normal limits.

Right Kidney: Length: 12.9 cm. Echogenicity within normal limits. No
solid mass or hydronephrosis visualized. 1.7 cm simple cyst. 1 cm
lower pole nonobstructing caliceal stone.

Left Kidney: Length: 13.0 cm. Echogenicity within normal limits. No
mass or hydronephrosis visualized.

Abdominal aorta: No aneurysm visualized.

Other findings: None.
IMPRESSION: 1. Multiple gallstones. No biliary distention. Echogenic liver
consistent with fatty infiltration and/or hepatobiliary disease .

2. 1 cm lower pole right renal nonobstructing calyceal stone .
cm right renal simple cyst.

## 2017-02-25 ENCOUNTER — Other Ambulatory Visit: Payer: Self-pay | Admitting: Family Medicine

## 2017-02-25 NOTE — Telephone Encounter (Signed)
Call in #[phone] with 6 rf

## 2017-03-20 ENCOUNTER — Other Ambulatory Visit: Payer: Self-pay | Admitting: Family Medicine

## 2017-06-22 ENCOUNTER — Telehealth: Payer: Self-pay | Admitting: Family Medicine

## 2017-06-22 NOTE — Telephone Encounter (Signed)
Pt calling stating that his Bp medication (amlodipine-valsartan) is recalled and would like to know what he she do.

## 2017-06-23 NOTE — Telephone Encounter (Signed)
I spoke with pt and the Valsartan he is taking is not one that has been recalled. He will continue with current medication.

## 2017-06-24 ENCOUNTER — Other Ambulatory Visit: Payer: Self-pay | Admitting: Family Medicine

## 2017-07-20 ENCOUNTER — Ambulatory Visit (INDEPENDENT_AMBULATORY_CARE_PROVIDER_SITE_OTHER): Payer: BC Managed Care – PPO | Admitting: Family Medicine

## 2017-07-20 ENCOUNTER — Encounter: Payer: Self-pay | Admitting: Family Medicine

## 2017-07-20 VITALS — BP 142/92 | HR 82 | Temp 97.9°F | Ht 70.0 in | Wt 228.0 lb

## 2017-07-20 DIAGNOSIS — Z0000 Encounter for general adult medical examination without abnormal findings: Secondary | ICD-10-CM

## 2017-07-20 LAB — BASIC METABOLIC PANEL
BUN: 18 mg/dL (ref 6–23)
CHLORIDE: 103 meq/L (ref 96–112)
CO2: 27 meq/L (ref 19–32)
CREATININE: 1.07 mg/dL (ref 0.40–1.50)
Calcium: 9.4 mg/dL (ref 8.4–10.5)
GFR: 79.61 mL/min (ref >60.00)
GLUCOSE: 115 mg/dL — AB (ref 70–99)
Potassium: 3.9 mEq/L (ref 3.5–5.1)
Sodium: 139 mEq/L (ref 135–145)

## 2017-07-20 LAB — LDL CHOLESTEROL, DIRECT: Direct LDL: 108 mg/dL

## 2017-07-20 LAB — POC URINALSYSI DIPSTICK (AUTOMATED)
BILIRUBIN UA: NEGATIVE
Glucose, UA: NEGATIVE
Ketones, UA: NEGATIVE
Leukocytes, UA: NEGATIVE
Nitrite, UA: NEGATIVE
PH UA: 6 (ref 5.0–8.0)
Protein, UA: NEGATIVE
RBC UA: NEGATIVE
Spec Grav, UA: >=1.03 — AB (ref 1.010–1.025)
Urobilinogen, UA: 0.2 E.U./dL

## 2017-07-20 LAB — LIPID PANEL
Cholesterol: 197 mg/dL (ref 0–200)
HDL: 29.7 mg/dL — ABNORMAL LOW (ref >39.00)
NONHDL: 167.54
Total CHOL/HDL Ratio: 7
Triglycerides: 272 mg/dL — ABNORMAL HIGH (ref 0.0–149.0)
VLDL: 54.4 mg/dL — ABNORMAL HIGH (ref 0.0–40.0)

## 2017-07-20 LAB — HEPATIC FUNCTION PANEL
ALBUMIN: 4.6 g/dL (ref 3.5–5.2)
ALK PHOS: 48 U/L (ref 39–117)
ALT: 37 U/L (ref 0–53)
AST: 21 U/L (ref 0–37)
BILIRUBIN DIRECT: 0.1 mg/dL (ref 0.0–0.3)
TOTAL PROTEIN: 6.8 g/dL (ref 6.0–8.3)
Total Bilirubin: 0.5 mg/dL (ref 0.2–1.2)

## 2017-07-20 LAB — CBC WITH DIFFERENTIAL/PLATELET
BASOS ABS: 0 10*3/uL (ref 0.0–0.1)
Basophils Relative: 0.6 % (ref 0.0–3.0)
EOS ABS: 0.2 10*3/uL (ref 0.0–0.7)
Eosinophils Relative: 3.4 % (ref 0.0–5.0)
HEMATOCRIT: 46.1 % (ref 39.0–52.0)
Hemoglobin: 15.6 g/dL (ref 13.0–17.0)
LYMPHS ABS: 2.4 10*3/uL (ref 0.7–4.0)
LYMPHS PCT: 37.7 % (ref 12.0–46.0)
MCHC: 33.8 g/dL (ref 30.0–36.0)
MCV: 88.9 fl (ref 78.0–100.0)
Monocytes Absolute: 0.8 10*3/uL (ref 0.1–1.0)
Monocytes Relative: 11.9 % (ref 3.0–12.0)
NEUTROS ABS: 3 10*3/uL (ref 1.4–7.7)
NEUTROS PCT: 46.4 % (ref 43.0–77.0)
PLATELETS: 272 10*3/uL (ref 150.0–400.0)
RBC: 5.19 Mil/uL (ref 4.22–5.81)
RDW: 12.8 % (ref 11.5–15.5)
WBC: 6.4 10*3/uL (ref 4.0–10.5)

## 2017-07-20 LAB — TSH: TSH: 3.93 u[IU]/mL (ref 0.35–4.50)

## 2017-07-20 MED ORDER — AMLODIPINE BESYLATE-VALSARTAN 10-320 MG PO TABS: 1.0000 | ORAL_TABLET | Freq: Every day | ORAL | 3 refills | Status: DC

## 2017-07-20 NOTE — Patient Instructions (Signed)
WE NOW OFFER   [facility] FAST TRACK!!!  SAME DAY Appointments for ACUTE CARE  Such as: Sprains, Injuries, cuts, abrasions, rashes, muscle pain, joint pain, back pain Colds, flu, sore throats, headache, allergies, cough, fever  Ear pain, sinus and eye infections Abdominal pain, nausea, vomiting, diarrhea, upset stomach Animal/insect bites  3 Easy Ways to Schedule: Walk-In Scheduling Call in scheduling Mychart Sign-up: [email or web address]         

## 2017-07-20 NOTE — Progress Notes (Signed)
   Subjective:    Patient ID: Darren Calhoun, male    DOB: 12/23/1970, 46 y.o.   MRN: [id number]  HPI Here for a well exam. He feels fine although he has been stressed the past 2 weeks with his 44 year old daughter being in the hospital. She is back home now. His BP had been stable at home.   Review of Systems  Constitutional: Negative.   HENT: Negative.   Eyes: Negative.   Respiratory: Negative.   Cardiovascular: Negative.   Gastrointestinal: Negative.   Genitourinary: Negative.   Musculoskeletal: Negative.   Skin: Negative.   Neurological: Negative.   Psychiatric/Behavioral: Negative.        Objective:   Physical Exam  Constitutional: He is oriented to person, place, and time. He appears well-developed and well-nourished. No distress.  HENT:  Head: Normocephalic and atraumatic.  Right Ear: External ear normal.  Left Ear: External ear normal.  Nose: Nose normal.  Mouth/Throat: Oropharynx is clear and moist. No oropharyngeal exudate.  Eyes: Pupils are equal, round, and reactive to light. Conjunctivae and EOM are normal. Right eye exhibits no discharge. Left eye exhibits no discharge. No scleral icterus.  Neck: Neck supple. No JVD present. No tracheal deviation present. No thyromegaly present.  Cardiovascular: Normal rate, regular rhythm, normal heart sounds and intact distal pulses.  Exam reveals no gallop and no friction rub.   No murmur heard. Pulmonary/Chest: Effort normal and breath sounds normal. No respiratory distress. He has no wheezes. He has no rales. He exhibits no tenderness.  Abdominal: Soft. Bowel sounds are normal. He exhibits no distension and no mass. There is no tenderness. There is no rebound and no guarding.  Genitourinary: Rectum normal, prostate normal and penis normal. Rectal exam shows guaiac negative stool. No penile tenderness.  Musculoskeletal: Normal range of motion. He exhibits no edema or tenderness.  Lymphadenopathy:    He has no cervical  adenopathy.  Neurological: He is alert and oriented to person, place, and time. He has normal reflexes. No cranial nerve deficit. He exhibits normal muscle tone. Coordination normal.  Skin: Skin is warm and dry. No rash noted. He is not diaphoretic. No erythema. No pallor.  Psychiatric: He has a normal mood and affect. His behavior is normal. Judgment and thought content normal.          Assessment & Plan:  Well exam. We discussed diet and exercise. Get fasting labs. Gershon CraneStephen Ronelle Smallman, MD

## 2017-08-27 ENCOUNTER — Encounter: Payer: Self-pay | Admitting: Family Medicine

## 2017-11-18 ENCOUNTER — Other Ambulatory Visit: Payer: Self-pay | Admitting: Family Medicine

## 2018-01-04 ENCOUNTER — Telehealth: Payer: Self-pay | Admitting: Family Medicine

## 2018-01-04 NOTE — Telephone Encounter (Signed)
Sent to PCP for approval.  

## 2018-01-04 NOTE — Telephone Encounter (Signed)
Copied from CRM [phone]. Topic: Quick Communication - See Telephone Encounter >> Jan 04, 2018  1:07 PM Eston Mouldavis, Jacqueleen Pulver B wrote: CRM for notification. See Telephone encounter for:  Daughter was dx with flu on Sunday,  he is having symptoms and want to know if he can have medication called in  (body aches, dry cough, headache,  no fever)   [facility] [address] - [place], [place] - [address] [phone] (Phone) [phone] (Fax)     01 /28/19.

## 2018-01-05 MED ORDER — OSELTAMIVIR PHOSPHATE 75 MG PO CAPS: 75.0000 mg | ORAL_CAPSULE | Freq: Two times a day (BID) | ORAL | 0 refills | Status: DC

## 2018-01-05 NOTE — Telephone Encounter (Signed)
Call in Tamiflu 75 mg bid for 5 days  

## 2018-01-05 NOTE — Telephone Encounter (Signed)
Called and spoke with pt's wife. Wife advised and voiced understanding.  

## 2018-02-22 ENCOUNTER — Other Ambulatory Visit: Payer: Self-pay | Admitting: Family Medicine

## 2018-05-03 ENCOUNTER — Other Ambulatory Visit: Payer: Self-pay | Admitting: Family Medicine

## 2018-05-25 ENCOUNTER — Other Ambulatory Visit: Payer: Self-pay | Admitting: Family Medicine

## 2018-07-14 ENCOUNTER — Other Ambulatory Visit: Payer: Self-pay | Admitting: Family Medicine

## 2018-07-14 DIAGNOSIS — Z0000 Encounter for general adult medical examination without abnormal findings: Secondary | ICD-10-CM

## 2018-07-14 NOTE — Telephone Encounter (Signed)
90 day Rx has been sent with no refills pt will need an OV for more refills

## 2018-07-15 ENCOUNTER — Encounter: Payer: BC Managed Care – PPO | Admitting: Family Medicine

## 2018-07-15 DIAGNOSIS — Z0289 Encounter for other administrative examinations: Secondary | ICD-10-CM

## 2018-09-03 ENCOUNTER — Ambulatory Visit (INDEPENDENT_AMBULATORY_CARE_PROVIDER_SITE_OTHER): Payer: BC Managed Care – PPO | Admitting: Adult Health

## 2018-09-03 ENCOUNTER — Encounter: Payer: Self-pay | Admitting: Adult Health

## 2018-09-03 VITALS — BP 122/90 | HR 95 | Ht 70.0 in

## 2018-09-03 DIAGNOSIS — M545 Low back pain, unspecified: Secondary | ICD-10-CM

## 2018-09-03 MED ORDER — METHYLPREDNISOLONE 4 MG PO TBPK: ORAL_TABLET | ORAL | 0 refills | Status: DC

## 2018-09-03 MED ORDER — TIZANIDINE HCL 2 MG PO TABS: 2.0000 mg | ORAL_TABLET | Freq: Every day | ORAL | 0 refills | Status: DC

## 2018-09-03 NOTE — Progress Notes (Signed)
Subjective:    Patient ID: Darren Calhoun, male    DOB: 1971-01-25, 47 y.o.   MRN: [id number]  HPI  47 year old male who  has a past medical history of Allergy, Anxiety, Headache(784.0), and Hypertension. He is a patient of Dr. Clent Ridges who I am seeing today for an acute issue of low back pain. Reports that he was playing tennis yesterday and " pulled his back out". He work up this morning with severe back pain and found it hard to walk. Pain is worse with walking, bending and twisting.   Denies any sciatica, or issues with bowel or bladder.   He has been using a heating pad and motrin without relief    Review of Systems See HPI   Past Medical History:  Diagnosis Date  . Allergy   . Anxiety   . Headache(784.0)   . Hypertension     Social History   Socioeconomic History  . Marital status: Married    Spouse name: Not on file  . Number of children: 3  . Years of education: Not on file  . Highest education level: Not on file  Occupational History  . Not on file  Social Needs  . Financial resource strain: Not on file  . Food insecurity:    Worry: Not on file    Inability: Not on file  . Transportation needs:    Medical: Not on file    Non-medical: Not on file  Tobacco Use  . Smoking status: Never Smoker  . Smokeless tobacco: Never Used  Substance and Sexual Activity  . Alcohol use: No    Alcohol/week: 0.0 standard drinks  . Drug use: No  . Sexual activity: Not on file  Lifestyle  . Physical activity:    Days per week: Not on file    Minutes per session: Not on file  . Stress: Not on file  Relationships  . Social connections:    Talks on phone: Not on file    Gets together: Not on file    Attends religious service: Not on file    Active member of club or organization: Not on file    Attends meetings of clubs or organizations: Not on file    Relationship status: Not on file  . Intimate partner violence:    Fear of current or ex partner: Not on file   Emotionally abused: Not on file    Physically abused: Not on file    Forced sexual activity: Not on file  Other Topics Concern  . Not on file  Social History Narrative  . Not on file    Past Surgical History:  Procedure Laterality Date  . KNEE ARTHROSCOPY     right  . wisdom teeth extracted      Family History  Problem Relation Age of Onset  . Diabetes Unknown   . Hyperlipidemia Unknown   . Hypertension Unknown   . Alcohol abuse Unknown   . Depression Unknown   . Colon cancer Maternal Grandfather     No Known Allergies  Current Outpatient Medications on File Prior to Visit  Medication Sig Dispense Refill  . amLODipine-valsartan (EXFORGE) 10-320 MG tablet TAKE ONE TABLET BY MOUTH DAILY 90 tablet 0  . cetirizine-pseudoephedrine (ZYRTEC-D) 5-120 MG tablet TAKE ONE TABLET BY MOUTH TWICE A DAY 60 tablet 2  . fluticasone (FLONASE) 50 MCG/ACT nasal spray INHALE 2 SPRAYS INTO THE NOSE DAILY. 16 g 2  . sertraline (ZOLOFT) 100 MG tablet TAKE  ONE TABLET BY MOUTH DAILY 90 tablet 10   No current facility-administered medications on file prior to visit.     BP 122/90 (BP Location: Left Arm, Patient Position: Sitting, Cuff Size: Large)   Pulse 95   Ht 5\' 10"  (1.778 m)   BMI 32.71 kg/m       Objective:   Physical Exam  Constitutional: He is oriented to person, place, and time. He appears well-developed and well-nourished. No distress.  Cardiovascular: Normal rate, regular rhythm, normal heart sounds and intact distal pulses.  Pulmonary/Chest: Effort normal and breath sounds normal.  Musculoskeletal: Normal range of motion. He exhibits tenderness. He exhibits no edema or deformity.  Throughout lower back. No spinal tenderness  Neurological: He is alert and oriented to person, place, and time.  Skin: He is not diaphoretic.  Nursing note and vitals reviewed.     Assessment & Plan:  1. Acute bilateral low back pain without sciatica - methylPREDNISolone (MEDROL DOSEPAK) 4 MG  TBPK tablet; Take as directed  Dispense: 21 tablet; Refill: 0 - tiZANidine (ZANAFLEX) 2 MG tablet; Take 1 tablet (2 mg total) by mouth at bedtime.  Dispense: 10 tablet; Refill: 0  Shirline Frees, NP

## 2018-10-04 ENCOUNTER — Encounter: Payer: Self-pay | Admitting: Family Medicine

## 2018-10-04 ENCOUNTER — Ambulatory Visit (INDEPENDENT_AMBULATORY_CARE_PROVIDER_SITE_OTHER): Payer: BC Managed Care – PPO | Admitting: Family Medicine

## 2018-10-04 VITALS — BP 124/80 | HR 95 | Temp 98.1°F | Wt 232.4 lb

## 2018-10-04 DIAGNOSIS — Z0000 Encounter for general adult medical examination without abnormal findings: Secondary | ICD-10-CM | POA: Diagnosis not present

## 2018-10-04 LAB — CBC WITH DIFFERENTIAL/PLATELET
BASOS ABS: 0.1 10*3/uL (ref 0.0–0.1)
Basophils Relative: 0.7 % (ref 0.0–3.0)
Eosinophils Absolute: 0.2 10*3/uL (ref 0.0–0.7)
Eosinophils Relative: 2.3 % (ref 0.0–5.0)
HEMATOCRIT: 44.9 % (ref 39.0–52.0)
Hemoglobin: 15.4 g/dL (ref 13.0–17.0)
LYMPHS PCT: 22.9 % (ref 12.0–46.0)
Lymphs Abs: 2 10*3/uL (ref 0.7–4.0)
MCHC: 34.4 g/dL (ref 30.0–36.0)
MCV: 88 fl (ref 78.0–100.0)
MONOS PCT: 8.9 % (ref 3.0–12.0)
Monocytes Absolute: 0.8 10*3/uL (ref 0.1–1.0)
Neutro Abs: 5.8 10*3/uL (ref 1.4–7.7)
Neutrophils Relative %: 65.2 % (ref 43.0–77.0)
Platelets: 282 10*3/uL (ref 150.0–400.0)
RBC: 5.1 Mil/uL (ref 4.22–5.81)
RDW: 13.2 % (ref 11.5–15.5)
WBC: 8.9 10*3/uL (ref 4.0–10.5)

## 2018-10-04 LAB — BASIC METABOLIC PANEL
BUN: 14 mg/dL (ref 6–23)
CO2: 30 mEq/L (ref 19–32)
Calcium: 9.5 mg/dL (ref 8.4–10.5)
Chloride: 102 mEq/L (ref 96–112)
Creatinine, Ser: 1.12 mg/dL (ref 0.40–1.50)
GFR: 75.11 mL/min (ref >60.00)
Glucose, Bld: 170 mg/dL — ABNORMAL HIGH (ref 70–99)
POTASSIUM: 4.3 meq/L (ref 3.5–5.1)
Sodium: 139 mEq/L (ref 135–145)

## 2018-10-04 LAB — HEPATIC FUNCTION PANEL
ALT: 37 U/L (ref 0–53)
AST: 19 U/L (ref 0–37)
Albumin: 4.5 g/dL (ref 3.5–5.2)
Alkaline Phosphatase: 51 U/L (ref 39–117)
BILIRUBIN DIRECT: 0.1 mg/dL (ref 0.0–0.3)
Total Bilirubin: 0.4 mg/dL (ref 0.2–1.2)
Total Protein: 7 g/dL (ref 6.0–8.3)

## 2018-10-04 LAB — POC URINALSYSI DIPSTICK (AUTOMATED)
BILIRUBIN UA: NEGATIVE
Blood, UA: NEGATIVE
Glucose, UA: NEGATIVE
Ketones, UA: NEGATIVE
LEUKOCYTES UA: NEGATIVE
Nitrite, UA: NEGATIVE
PH UA: 6 (ref 5.0–8.0)
PROTEIN UA: NEGATIVE
Spec Grav, UA: >=1.03 — AB (ref 1.010–1.025)
Urobilinogen, UA: 0.2 E.U./dL

## 2018-10-04 LAB — LIPID PANEL
CHOL/HDL RATIO: 7
Cholesterol: 193 mg/dL (ref 0–200)
HDL: 29.3 mg/dL — ABNORMAL LOW (ref >39.00)

## 2018-10-04 LAB — TSH: TSH: 1.73 u[IU]/mL (ref 0.35–4.50)

## 2018-10-04 LAB — LDL CHOLESTEROL, DIRECT: Direct LDL: 94 mg/dL

## 2018-10-04 LAB — HEMOGLOBIN A1C: Hgb A1c MFr Bld: 6.4 % (ref 4.6–6.5)

## 2018-10-04 MED ORDER — AMLODIPINE BESYLATE-VALSARTAN 10-320 MG PO TABS: 1.0000 | ORAL_TABLET | Freq: Every day | ORAL | 3 refills | Status: DC

## 2018-10-04 NOTE — Progress Notes (Signed)
   Subjective:    Patient ID: Darren Calhoun, male    DOB: [birth year], 47 y.o.   MRN: [id number]  HPI Here for a well exam. He is doing well.    Review of Systems  Constitutional: Negative.   HENT: Negative.   Eyes: Negative.   Respiratory: Negative.   Cardiovascular: Negative.   Gastrointestinal: Negative.   Genitourinary: Negative.   Musculoskeletal: Negative.   Skin: Negative.   Neurological: Negative.   Psychiatric/Behavioral: Negative.        Objective:   Physical Exam  Constitutional: He is oriented to person, place, and time. He appears well-developed and well-nourished. No distress.  HENT:  Head: Normocephalic and atraumatic.  Right Ear: External ear normal.  Left Ear: External ear normal.  Nose: Nose normal.  Mouth/Throat: Oropharynx is clear and moist. No oropharyngeal exudate.  Eyes: Pupils are equal, round, and reactive to light. Conjunctivae and EOM are normal. Right eye exhibits no discharge. Left eye exhibits no discharge. No scleral icterus.  Neck: Neck supple. No JVD present. No tracheal deviation present. No thyromegaly present.  Cardiovascular: Normal rate, regular rhythm, normal heart sounds and intact distal pulses. Exam reveals no gallop and no friction rub.  No murmur heard. Pulmonary/Chest: Effort normal and breath sounds normal. No respiratory distress. He has no wheezes. He has no rales. He exhibits no tenderness.  Abdominal: Soft. Bowel sounds are normal. He exhibits no distension and no mass. There is no tenderness. There is no rebound and no guarding.  Genitourinary: Penis normal. No penile tenderness.  Musculoskeletal: Normal range of motion. He exhibits no edema or tenderness.  Lymphadenopathy:    He has no cervical adenopathy.  Neurological: He is alert and oriented to person, place, and time. He has normal reflexes. He displays normal reflexes. No cranial nerve deficit. He exhibits normal muscle tone. Coordination normal.  Skin: Skin is warm  and dry. No rash noted. He is not diaphoretic. No erythema. No pallor.  Psychiatric: He has a normal mood and affect. His behavior is normal. Judgment and thought content normal.          Assessment & Plan:  Well exam. We discussed diet and exercise. Get fasting labs. Gershon Crane, MD

## 2018-10-06 MED ORDER — CETIRIZINE-PSEUDOEPHEDRINE ER 5-120 MG PO TB12: 1.0000 | ORAL_TABLET | Freq: Two times a day (BID) | ORAL | 11 refills | Status: DC

## 2018-10-06 MED ORDER — AZITHROMYCIN 250 MG PO TABS: ORAL_TABLET | ORAL | 0 refills | Status: DC

## 2018-10-06 NOTE — Addendum Note (Signed)
Addended by: Gershon Crane A on: 10/06/2018 03:37 PM   Modules accepted: Orders

## 2018-10-06 NOTE — Addendum Note (Signed)
Addended by: Gershon Crane A on: 10/06/2018 03:46 PM   Modules accepted: Orders

## 2019-01-19 ENCOUNTER — Ambulatory Visit (INDEPENDENT_AMBULATORY_CARE_PROVIDER_SITE_OTHER): Payer: BC Managed Care – PPO | Admitting: Family Medicine

## 2019-01-19 ENCOUNTER — Encounter: Payer: Self-pay | Admitting: Family Medicine

## 2019-01-19 VITALS — BP 100/78 | HR 107 | Temp 99.2°F | Ht 70.0 in | Wt 234.2 lb

## 2019-01-19 DIAGNOSIS — R6889 Other general symptoms and signs: Secondary | ICD-10-CM

## 2019-01-19 DIAGNOSIS — J101 Influenza due to other identified influenza virus with other respiratory manifestations: Secondary | ICD-10-CM | POA: Diagnosis not present

## 2019-01-19 LAB — POC INFLUENZA A&B (BINAX/QUICKVUE)
Influenza A, POC: POSITIVE — AB
Influenza B, POC: NEGATIVE

## 2019-01-19 MED ORDER — OSELTAMIVIR PHOSPHATE 75 MG PO CAPS: 75.0000 mg | ORAL_CAPSULE | Freq: Two times a day (BID) | ORAL | 0 refills | Status: DC

## 2019-01-19 NOTE — Progress Notes (Signed)
   Subjective:    Patient ID: Darren Calhoun, male    DOB: 12/30/1970, 48 y.o.   MRN: [id number]  HPI Here for 3 days of fever to 100.3 degrees, body aches, headache, and a dry cough. On Ibuprofen and Delsym.    Review of Systems  Constitutional: Positive for fever.  HENT: Positive for congestion and postnasal drip. Negative for sore throat.   Eyes: Negative.   Respiratory: Positive for cough.   Gastrointestinal: Negative.        Objective:   Physical Exam Constitutional:      Appearance: He is well-developed. He is ill-appearing.  HENT:     Right Ear: Tympanic membrane and ear canal normal.     Left Ear: Tympanic membrane and ear canal normal.     Nose: Nose normal.     Mouth/Throat:     Pharynx: Oropharynx is clear.  Eyes:     Conjunctiva/sclera: Conjunctivae normal.  Pulmonary:     Effort: Pulmonary effort is normal. No respiratory distress.     Breath sounds: Normal breath sounds. No stridor. No wheezing, rhonchi or rales.  Lymphadenopathy:     Cervical: No cervical adenopathy.  Neurological:     Mental Status: He is alert.           Assessment & Plan:  Influenza A. Treat with Tamiflu.  Gershon CraneStephen Ariyel Jeangilles, MD

## 2019-05-04 ENCOUNTER — Telehealth: Payer: Self-pay | Admitting: Family Medicine

## 2019-05-04 NOTE — Telephone Encounter (Signed)
Please send medication if approved  [facility] [address] [place], [place] [address]   [facility] was [facility]. Patient would like a call if approved, [phone]

## 2019-07-15 ENCOUNTER — Other Ambulatory Visit: Payer: Self-pay | Admitting: Family Medicine

## 2019-08-05 ENCOUNTER — Other Ambulatory Visit: Payer: Self-pay | Admitting: Family Medicine

## 2019-08-05 DIAGNOSIS — Z0000 Encounter for general adult medical examination without abnormal findings: Secondary | ICD-10-CM

## 2019-09-26 ENCOUNTER — Ambulatory Visit: Payer: BC Managed Care – PPO | Admitting: Family Medicine

## 2019-09-26 ENCOUNTER — Other Ambulatory Visit: Payer: Self-pay

## 2019-09-26 ENCOUNTER — Ambulatory Visit (INDEPENDENT_AMBULATORY_CARE_PROVIDER_SITE_OTHER): Payer: BC Managed Care – PPO | Admitting: Family Medicine

## 2019-09-26 VITALS — BP 122/76 | HR 88 | Temp 98.3°F | Resp 16 | Ht 70.0 in | Wt 227.4 lb

## 2019-09-26 DIAGNOSIS — M545 Low back pain, unspecified: Secondary | ICD-10-CM

## 2019-09-26 DIAGNOSIS — R3989 Other symptoms and signs involving the genitourinary system: Secondary | ICD-10-CM | POA: Diagnosis not present

## 2019-09-26 LAB — POCT URINALYSIS DIPSTICK
Bilirubin, UA: NEGATIVE
Blood, UA: NEGATIVE
Glucose, UA: NEGATIVE
Ketones, UA: NEGATIVE
Leukocytes, UA: NEGATIVE
Nitrite, UA: NEGATIVE
Protein, UA: POSITIVE — AB
Spec Grav, UA: 1.015 (ref 1.010–1.025)
Urobilinogen, UA: 0.2 E.U./dL
pH, UA: 6 (ref 5.0–8.0)

## 2019-09-26 NOTE — Progress Notes (Signed)
  Subjective:     Patient ID: Darren Calhoun, male   DOB: December 21, 1970, 48 y.o.   MRN: [id number]  HPI   [name] is seen with recent low back pain several weeks ago but now resolved.  His pain was somewhat right lower lumbar and flank area.  He was down at the [facility] when this occurred.  His back pain resolved about a week ago.  Concurrently, he had some darker colored urine which was also somewhat intermittent.  He noticed some pinkish to darker discoloration.  He wonders if he may have had a kidney stone.  He has not had any recent burning with urination.  He had CT renal stone study back in February 2017 which showed 5 mm kidney stone right kidney and 2.1 cm probable renal cyst.  He has not had any recent appetite or weight changes.  No fever.  Back pain totally resolved at this time  Past Medical History:  Diagnosis Date  . Allergy   . Anxiety   . Headache(784.0)   . Hypertension    Past Surgical History:  Procedure Laterality Date  . KNEE ARTHROSCOPY     right  . wisdom teeth extracted      reports that he has never smoked. He has never used smokeless tobacco. He reports that he does not drink alcohol or use drugs. family history includes Alcohol abuse in his unknown relative; Colon cancer in his maternal grandfather; Depression in his unknown relative; Diabetes in his unknown relative; Hyperlipidemia in his unknown relative; Hypertension in his unknown relative. No Known Allergies   Review of Systems  Constitutional: Negative for chills and fever.  Gastrointestinal: Negative for abdominal pain.  Genitourinary: Negative for dysuria and testicular pain.       Objective:   Physical Exam Vitals signs reviewed.  Constitutional:      Appearance: Normal appearance.  Cardiovascular:     Rate and Rhythm: Normal rate and regular rhythm.  Pulmonary:     Effort: Pulmonary effort is normal.     Breath sounds: Normal breath sounds.  Neurological:     Mental Status: He is alert.        Assessment:     Patient had recent lower lumbar back pain which is resolved.  He described abnormal looking urine but urine today by dipstick is completely clear.  Question is whether he passed recent kidney stone which seems suspicious because of his recent intermittent pain.  He also noted his urine cleared as soon as his back pain resolved    Plan:     -Recommend observation at this point.  Plenty of fluids. -Consider lower oxalate diet -Stressed importance of prompt follow-up if he has any recurrent bleeding-or urine discoloration whatsoever  Eulas Post MD [facility]

## 2019-09-26 NOTE — Patient Instructions (Signed)
Dietary Guidelines to Help Prevent Kidney Stones Kidney stones are deposits of minerals and salts that form inside your kidneys. Your risk of developing kidney stones may be greater depending on your diet, your lifestyle, the medicines you take, and whether you have certain medical conditions. Most people can reduce their chances of developing kidney stones by following the instructions below. Depending on your overall health and the type of kidney stones you tend to develop, your dietitian may give you more specific instructions. What are tips for following this plan? Reading food labels  Choose foods with "no salt added" or "low-salt" labels. Limit your sodium intake to less than 1500 mg per day.  Choose foods with calcium for each meal and snack. Try to eat about 300 mg of calcium at each meal. Foods that contain 200-500 mg of calcium per serving include: ? 8 oz (237 ml) of milk, fortified nondairy milk, and fortified fruit juice. ? 8 oz (237 ml) of kefir, yogurt, and soy yogurt. ? 4 oz (118 ml) of tofu. ? 1 oz of cheese. ? 1 cup (300 g) of dried figs. ? 1 cup (91 g) of cooked broccoli. ? 1-3 oz can of sardines or mackerel.  Most people need 1000 to 1500 mg of calcium each day. Talk to your dietitian about how much calcium is recommended for you. Shopping  Buy plenty of fresh fruits and vegetables. Most people do not need to avoid fruits and vegetables, even if they contain nutrients that may contribute to kidney stones.  When shopping for convenience foods, choose: ? Whole pieces of fruit. ? Premade salads with dressing on the side. ? Low-fat fruit and yogurt smoothies.  Avoid buying frozen meals or prepared deli foods.  Look for foods with live cultures, such as yogurt and kefir. Cooking  Do not add salt to food when cooking. Place a salt shaker on the table and allow each person to add his or her own salt to taste.  Use vegetable protein, such as beans, textured vegetable  protein (TVP), or tofu instead of meat in pasta, casseroles, and soups. Meal planning   Eat less salt, if told by your dietitian. To do this: ? Avoid eating processed or premade food. ? Avoid eating fast food.  Eat less animal protein, including cheese, meat, poultry, or fish, if told by your dietitian. To do this: ? Limit the number of times you have meat, poultry, fish, or cheese each week. Eat a diet free of meat at least 2 days a week. ? Eat only one serving each day of meat, poultry, fish, or seafood. ? When you prepare animal protein, cut pieces into small portion sizes. For most meat and fish, one serving is about the size of one deck of cards.  Eat at least 5 servings of fresh fruits and vegetables each day. To do this: ? Keep fruits and vegetables on hand for snacks. ? Eat 1 piece of fruit or a handful of berries with breakfast. ? Have a salad and fruit at lunch. ? Have two kinds of vegetables at dinner.  Limit foods that are high in a substance called oxalate. These include: ? Spinach. ? Rhubarb. ? Beets. ? Potato chips and french fries. ? Nuts.  If you regularly take a diuretic medicine, make sure to eat at least 1-2 fruits or vegetables high in potassium each day. These include: ? Avocado. ? Banana. ? Orange, prune, carrot, or tomato juice. ? Baked potato. ? Cabbage. ? Beans and split   peas. General instructions   Drink enough fluid to keep your urine clear or pale yellow. This is the most important thing you can do.  Talk to your health care provider and dietitian about taking daily supplements. Depending on your health and the cause of your kidney stones, you may be advised: ? Not to take supplements with vitamin C. ? To take a calcium supplement. ? To take a daily probiotic supplement. ? To take other supplements such as magnesium, fish oil, or vitamin B6.  Take all medicines and supplements as told by your health care provider.  Limit alcohol intake to no  more than 1 drink a day for nonpregnant women and 2 drinks a day for men. One drink equals 12 oz of beer, 5 oz of wine, or 1 oz of hard liquor.  Lose weight if told by your health care provider. Work with your dietitian to find strategies and an eating plan that works best for you. What foods are not recommended? Limit your intake of the following foods, or as told by your dietitian. Talk to your dietitian about specific foods you should avoid based on the type of kidney stones and your overall health. Grains Breads. Bagels. Rolls. Baked goods. Salted crackers. Cereal. Pasta. Vegetables Spinach. Rhubarb. Beets. Canned vegetables. Pickles. Olives. Meats and other protein foods Nuts. Nut butters. Large portions of meat, poultry, or fish. Salted or cured meats. Deli meats. Hot dogs. Sausages. Dairy Cheese. Beverages Regular soft drinks. Regular vegetable juice. Seasonings and other foods Seasoning blends with salt. Salad dressings. Canned soups. Soy sauce. Ketchup. Barbecue sauce. Canned pasta sauce. Casseroles. Pizza. Lasagna. Frozen meals. Potato chips. French fries. Summary  You can reduce your risk of kidney stones by making changes to your diet.  The most important thing you can do is drink enough fluid. You should drink enough fluid to keep your urine clear or pale yellow.  Ask your health care provider or dietitian how much protein from animal sources you should eat each day, and also how much salt and calcium you should have each day. This information is not intended to replace advice given to you by your health care provider. Make sure you discuss any questions you have with your health care provider. Document Released: 03/21/2011 Document Revised: 03/16/2019 Document Reviewed: 11/04/2016 Elsevier Patient Education  2020 Elsevier Inc.  

## 2019-10-18 ENCOUNTER — Encounter: Payer: Self-pay | Admitting: Family Medicine

## 2019-10-18 ENCOUNTER — Other Ambulatory Visit: Payer: Self-pay

## 2019-10-18 ENCOUNTER — Ambulatory Visit (INDEPENDENT_AMBULATORY_CARE_PROVIDER_SITE_OTHER): Payer: BC Managed Care – PPO | Admitting: Family Medicine

## 2019-10-18 VITALS — BP 132/86 | HR 88 | Temp 98.2°F | Ht 68.75 in | Wt 226.0 lb

## 2019-10-18 DIAGNOSIS — Z0000 Encounter for general adult medical examination without abnormal findings: Secondary | ICD-10-CM

## 2019-10-18 LAB — POC URINALSYSI DIPSTICK (AUTOMATED)
Bilirubin, UA: NEGATIVE
Blood, UA: NEGATIVE
Glucose, UA: NEGATIVE
Ketones, UA: NEGATIVE
Leukocytes, UA: NEGATIVE
Nitrite, UA: NEGATIVE
Protein, UA: NEGATIVE
Spec Grav, UA: 1.02 (ref 1.010–1.025)
Urobilinogen, UA: 0.2 E.U./dL
pH, UA: 6 (ref 5.0–8.0)

## 2019-10-18 MED ORDER — SERTRALINE HCL 100 MG PO TABS: 100.0000 mg | ORAL_TABLET | Freq: Every day | ORAL | 3 refills | Status: DC

## 2019-10-18 MED ORDER — AMLODIPINE BESYLATE-VALSARTAN 10-320 MG PO TABS: 1.0000 | ORAL_TABLET | Freq: Every day | ORAL | 3 refills | Status: DC

## 2019-10-18 MED ORDER — CETIRIZINE-PSEUDOEPHEDRINE ER 5-120 MG PO TB12: 1.0000 | ORAL_TABLET | Freq: Two times a day (BID) | ORAL | 11 refills | Status: DC

## 2019-10-18 NOTE — Patient Instructions (Signed)
Health Maintenance Due  Topic Date Due  . PNEUMOCOCCAL POLYSACCHARIDE VACCINE AGE 46-64 HIGH RISK  01/15/1975  . FOOT EXAM  01/15/1983  . OPHTHALMOLOGY EXAM  01/15/1983  . TETANUS/TDAP  01/16/1992  . HEMOGLOBIN A1C  04/05/2019  . INFLUENZA VACCINE  07/09/2019    Depression screen [facility] 2/9 10/04/2018 02/10/2017  Decreased Interest 0 0  Down, Depressed, Hopeless 0 0  PHQ - 2 Score 0 0

## 2019-10-18 NOTE — Progress Notes (Signed)
   Subjective:    Patient ID: Darren Calhoun, male    DOB: 1971/06/22, 48 y.o.   MRN: [id number]  HPI Here for a well exam. He feels fine.   Review of Systems  Constitutional: Negative.   HENT: Negative.   Eyes: Negative.   Respiratory: Negative.   Cardiovascular: Negative.   Gastrointestinal: Negative.   Genitourinary: Negative.   Musculoskeletal: Negative.   Skin: Negative.   Neurological: Negative.   Psychiatric/Behavioral: Negative.        Objective:   Physical Exam Constitutional:      General: He is not in acute distress.    Appearance: He is well-developed. He is not diaphoretic.  HENT:     Head: Normocephalic and atraumatic.     Right Ear: External ear normal.     Left Ear: External ear normal.     Nose: Nose normal.     Mouth/Throat:     Pharynx: No oropharyngeal exudate.  Eyes:     General: No scleral icterus.       Right eye: No discharge.        Left eye: No discharge.     Conjunctiva/sclera: Conjunctivae normal.     Pupils: Pupils are equal, round, and reactive to light.  Neck:     Musculoskeletal: Neck supple.     Thyroid: No thyromegaly.     Vascular: No JVD.     Trachea: No tracheal deviation.  Cardiovascular:     Rate and Rhythm: Normal rate and regular rhythm.     Heart sounds: Normal heart sounds. No murmur. No friction rub. No gallop.   Pulmonary:     Effort: Pulmonary effort is normal. No respiratory distress.     Breath sounds: Normal breath sounds. No wheezing or rales.  Chest:     Chest wall: No tenderness.  Abdominal:     General: Bowel sounds are normal. There is no distension.     Palpations: Abdomen is soft. There is no mass.     Tenderness: There is no abdominal tenderness. There is no guarding or rebound.  Genitourinary:    Penis: No tenderness.      Scrotum/Testes: Normal.  Musculoskeletal: Normal range of motion.        General: No tenderness.  Lymphadenopathy:     Cervical: No cervical adenopathy.  Skin:    General:  Skin is warm and dry.     Coloration: Skin is not pale.     Findings: No erythema or rash.  Neurological:     Mental Status: He is alert and oriented to person, place, and time.     Cranial Nerves: No cranial nerve deficit.     Motor: No abnormal muscle tone.     Coordination: Coordination normal.     Deep Tendon Reflexes: Reflexes are normal and symmetric. Reflexes normal.  Psychiatric:        Behavior: Behavior normal.        Thought Content: Thought content normal.        Judgment: Judgment normal.           Assessment & Plan:  Well exam. We discussed diet and exercise. Get fasting labs soon.  Alysia Penna, MD

## 2019-10-20 ENCOUNTER — Other Ambulatory Visit (INDEPENDENT_AMBULATORY_CARE_PROVIDER_SITE_OTHER): Payer: BC Managed Care – PPO

## 2019-10-20 ENCOUNTER — Other Ambulatory Visit: Payer: Self-pay

## 2019-10-20 DIAGNOSIS — Z0000 Encounter for general adult medical examination without abnormal findings: Secondary | ICD-10-CM

## 2019-10-20 LAB — BASIC METABOLIC PANEL
BUN: 15 mg/dL (ref 6–23)
CO2: 27 mEq/L (ref 19–32)
Calcium: 9 mg/dL (ref 8.4–10.5)
Chloride: 103 mEq/L (ref 96–112)
Creatinine, Ser: 1.07 mg/dL (ref 0.40–1.50)
GFR: 74.16 mL/min (ref >60.00)
Glucose, Bld: 130 mg/dL — ABNORMAL HIGH (ref 70–99)
Potassium: 4.1 mEq/L (ref 3.5–5.1)
Sodium: 139 mEq/L (ref 135–145)

## 2019-10-20 LAB — LIPID PANEL
Cholesterol: 212 mg/dL — ABNORMAL HIGH (ref 0–200)
HDL: 32.4 mg/dL — ABNORMAL LOW (ref >39.00)
NonHDL: 179.75
Total CHOL/HDL Ratio: 7
Triglycerides: 305 mg/dL — ABNORMAL HIGH (ref 0.0–149.0)
VLDL: 61 mg/dL — ABNORMAL HIGH (ref 0.0–40.0)

## 2019-10-20 LAB — CBC WITH DIFFERENTIAL/PLATELET
Basophils Absolute: 0.1 10*3/uL (ref 0.0–0.1)
Basophils Relative: 1.1 % (ref 0.0–3.0)
Eosinophils Absolute: 0.3 10*3/uL (ref 0.0–0.7)
Eosinophils Relative: 4.8 % (ref 0.0–5.0)
HCT: 44 % (ref 39.0–52.0)
Hemoglobin: 15.2 g/dL (ref 13.0–17.0)
Lymphocytes Relative: 36.1 % (ref 12.0–46.0)
Lymphs Abs: 2.4 10*3/uL (ref 0.7–4.0)
MCHC: 34.6 g/dL (ref 30.0–36.0)
MCV: 87.9 fl (ref 78.0–100.0)
Monocytes Absolute: 0.8 10*3/uL (ref 0.1–1.0)
Monocytes Relative: 11.8 % (ref 3.0–12.0)
Neutro Abs: 3 10*3/uL (ref 1.4–7.7)
Neutrophils Relative %: 46.2 % (ref 43.0–77.0)
Platelets: 264 10*3/uL (ref 150.0–400.0)
RBC: 5.01 Mil/uL (ref 4.22–5.81)
RDW: 13.4 % (ref 11.5–15.5)
WBC: 6.5 10*3/uL (ref 4.0–10.5)

## 2019-10-20 LAB — HEPATIC FUNCTION PANEL
ALT: 50 U/L (ref 0–53)
AST: 29 U/L (ref 0–37)
Albumin: 4.5 g/dL (ref 3.5–5.2)
Alkaline Phosphatase: 47 U/L (ref 39–117)
Bilirubin, Direct: 0.1 mg/dL (ref 0.0–0.3)
Total Bilirubin: 0.4 mg/dL (ref 0.2–1.2)
Total Protein: 7 g/dL (ref 6.0–8.3)

## 2019-10-20 LAB — TSH: TSH: 3.69 u[IU]/mL (ref 0.35–4.50)

## 2019-10-20 LAB — LDL CHOLESTEROL, DIRECT: Direct LDL: 105 mg/dL

## 2019-12-06 ENCOUNTER — Other Ambulatory Visit: Payer: Self-pay

## 2019-12-06 ENCOUNTER — Telehealth (INDEPENDENT_AMBULATORY_CARE_PROVIDER_SITE_OTHER): Payer: BC Managed Care – PPO | Admitting: Family Medicine

## 2019-12-06 DIAGNOSIS — R319 Hematuria, unspecified: Secondary | ICD-10-CM | POA: Diagnosis not present

## 2019-12-06 DIAGNOSIS — N390 Urinary tract infection, site not specified: Secondary | ICD-10-CM

## 2019-12-06 LAB — POCT URINALYSIS DIPSTICK
Bilirubin, UA: NEGATIVE
Glucose, UA: NEGATIVE
Nitrite, UA: NEGATIVE
Protein, UA: POSITIVE — AB
Spec Grav, UA: 1.025 (ref 1.010–1.025)
Urobilinogen, UA: 0.2 E.U./dL
pH, UA: 6 (ref 5.0–8.0)

## 2019-12-06 MED ORDER — CIPROFLOXACIN HCL 500 MG PO TABS: 500.0000 mg | ORAL_TABLET | Freq: Two times a day (BID) | ORAL | 0 refills | Status: DC

## 2019-12-06 NOTE — Progress Notes (Signed)
Virtual Visit via Video Note  I connected with the patient on 12/06/19 at  2:00 PM EST by a video enabled telemedicine application and verified that I am speaking with the correct person using two identifiers.  Location patient: home Location provider:work or home office Persons participating in the virtual visit: patient, provider  I discussed the limitations of evaluation and management by telemedicine and the availability of in person appointments. The patient expressed understanding and agreed to proceed.   HPI: Here for seeing apparent blood in his urine this morning. This happened in October and he saw Dr. Elease Hashimoto for it. However by the time he could give a urina sample, it ws clear. Then again at 5 am today as he urinated, the urine had a dark red color. No particular odor. He has no pain or fever or urgency or burning. He dropped off a sample earlier today at our lab, and it had 3+ blood and 3+ leukocytes.    ROS: See pertinent positives and negatives per HPI.  Past Medical History:  Diagnosis Date  . Allergy   . Anxiety   . Headache(784.0)   . Hypertension     Past Surgical History:  Procedure Laterality Date  . KNEE ARTHROSCOPY     right  . wisdom teeth extracted      Family History  Problem Relation Age of Onset  . Diabetes Other   . Hyperlipidemia Other   . Hypertension Other   . Alcohol abuse Other   . Depression Other   . Colon cancer Maternal Grandfather      Current Outpatient Medications:  .  amLODipine-valsartan (EXFORGE) 10-320 MG tablet, Take 1 tablet by mouth daily., Disp: 90 tablet, Rfl: 3 .  cetirizine-pseudoephedrine (ZYRTEC-D) 5-120 MG tablet, Take 1 tablet by mouth 2 (two) times daily., Disp: 60 tablet, Rfl: 11 .  ciprofloxacin (CIPRO) 500 MG tablet, Take 1 tablet (500 mg total) by mouth 2 (two) times daily., Disp: 20 tablet, Rfl: 0 .  fluticasone (FLONASE) 50 MCG/ACT nasal spray, INHALE 2 SPRAYS INTO THE NOSE DAILY., Disp: 16 g, Rfl: 2 .   sertraline (ZOLOFT) 100 MG tablet, Take 1 tablet (100 mg total) by mouth daily., Disp: 90 tablet, Rfl: 3  EXAM:  VITALS per patient if applicable:  GENERAL: alert, oriented, appears well and in no acute distress  HEENT: atraumatic, conjunttiva clear, no obvious abnormalities on inspection of external nose and ears  NECK: normal movements of the head and neck  LUNGS: on inspection no signs of respiratory distress, breathing rate appears normal, no obvious gross SOB, gasping or wheezing  CV: no obvious cyanosis  MS: moves all visible extremities without noticeable abnormality  PSYCH/NEURO: pleasant and cooperative, no obvious depression or anxiety, speech and thought processing grossly intact  ASSESSMENT AND PLAN: UTI, likely a prostatitis. Treat with Cipro for 10 days. Then a week after that he will come by for another UA to make sure the infection has resolved.  Alysia Penna, MD  Discussed the following assessment and plan:  Hematuria, unspecified type - Plan: POC Urinalysis Dipstick, Urine Culture     I discussed the assessment and treatment plan with the patient. The patient was provided an opportunity to ask questions and all were answered. The patient agreed with the plan and demonstrated an understanding of the instructions.   The patient was advised to call back or seek an in-person evaluation if the symptoms worsen or if the condition fails to improve as anticipated.

## 2019-12-08 LAB — URINE CULTURE
MICRO NUMBER:: 1236879
Result:: NO GROWTH
SPECIMEN QUALITY:: ADEQUATE

## 2019-12-16 ENCOUNTER — Encounter: Payer: Self-pay | Admitting: Family Medicine

## 2019-12-16 DIAGNOSIS — R319 Hematuria, unspecified: Secondary | ICD-10-CM

## 2019-12-18 NOTE — Telephone Encounter (Signed)
The pink in his urine is likely some blood remaining. Tell him to stop the Cipro, and instead call in Doxycycline 100 mg bid for 10 days

## 2019-12-19 MED ORDER — DOXYCYCLINE HYCLATE 100 MG PO TABS: 100.0000 mg | ORAL_TABLET | Freq: Two times a day (BID) | ORAL | 0 refills | Status: DC

## 2019-12-19 NOTE — Telephone Encounter (Signed)
That's fine, just finish up the first one (Cipro)

## 2019-12-26 NOTE — Telephone Encounter (Signed)
Tell him to try the Doxycycline. If that doesn't work I plan to refer him to Urology. Hopefully it will

## 2020-01-02 NOTE — Telephone Encounter (Signed)
Yes let's check a UA on Friday

## 2020-01-03 ENCOUNTER — Telehealth: Payer: Self-pay | Admitting: Family Medicine

## 2020-01-03 DIAGNOSIS — R319 Hematuria, unspecified: Secondary | ICD-10-CM

## 2020-01-03 NOTE — Telephone Encounter (Signed)
The patient in coming in Friday for a UA but there is not any orders in.  Can you place the orders please?

## 2020-01-05 ENCOUNTER — Other Ambulatory Visit: Payer: Self-pay

## 2020-01-06 ENCOUNTER — Other Ambulatory Visit (INDEPENDENT_AMBULATORY_CARE_PROVIDER_SITE_OTHER): Payer: BC Managed Care – PPO

## 2020-01-06 DIAGNOSIS — R319 Hematuria, unspecified: Secondary | ICD-10-CM | POA: Diagnosis not present

## 2020-01-06 LAB — URINALYSIS, ROUTINE W REFLEX MICROSCOPIC
Bilirubin Urine: NEGATIVE
Ketones, ur: NEGATIVE
Leukocytes,Ua: NEGATIVE
Nitrite: NEGATIVE
Specific Gravity, Urine: 1.02 (ref 1.000–1.030)
Total Protein, Urine: 30 — AB
Urine Glucose: NEGATIVE
Urobilinogen, UA: 0.2 (ref 0.0–1.0)
pH: 6.5 (ref 5.0–8.0)

## 2020-01-06 NOTE — Telephone Encounter (Signed)
I have referred him to Urology (the urine still haas blood in it)

## 2020-01-06 NOTE — Addendum Note (Signed)
Addended by: Gershon Crane A on: 01/06/2020 04:55 PM   Modules accepted: Orders

## 2020-02-08 LAB — PSA: PSA: 0.65

## 2020-03-12 ENCOUNTER — Other Ambulatory Visit: Payer: Self-pay | Admitting: Urology

## 2020-03-12 ENCOUNTER — Other Ambulatory Visit (HOSPITAL_COMMUNITY)
Admission: RE | Admit: 2020-03-12 | Discharge: 2020-03-12 | Disposition: A | Discharge status: Home / Self Care | Payer: BC Managed Care – PPO | Source: Ambulatory Visit | Attending: Urology | Admitting: Urology

## 2020-03-12 DIAGNOSIS — Z01812 Encounter for preprocedural laboratory examination: Secondary | ICD-10-CM | POA: Insufficient documentation

## 2020-03-12 DIAGNOSIS — Z20822 Contact with and (suspected) exposure to covid-19: Secondary | ICD-10-CM | POA: Diagnosis not present

## 2020-03-12 LAB — SARS CORONAVIRUS 2 (TAT 6-24 HRS): SARS Coronavirus 2: NEGATIVE

## 2020-03-14 ENCOUNTER — Encounter (HOSPITAL_BASED_OUTPATIENT_CLINIC_OR_DEPARTMENT_OTHER): Payer: Self-pay | Admitting: Urology

## 2020-03-14 NOTE — Progress Notes (Signed)
Patient to arrive at 8:00am tomorrow morning. Instructed to take amlodipine as he normally takes it. (Patient states he takes his dose at 12 midnight and 12 noon)  History and all medications reviewed. Pre-op instructions given.

## 2020-03-15 ENCOUNTER — Encounter (HOSPITAL_BASED_OUTPATIENT_CLINIC_OR_DEPARTMENT_OTHER): Payer: Self-pay | Admitting: Urology

## 2020-03-15 ENCOUNTER — Other Ambulatory Visit: Payer: Self-pay

## 2020-03-15 ENCOUNTER — Ambulatory Visit (HOSPITAL_BASED_OUTPATIENT_CLINIC_OR_DEPARTMENT_OTHER)
Admission: RE | Admit: 2020-03-15 | Discharge: 2020-03-15 | Disposition: A | Discharge status: Home / Self Care | Payer: BC Managed Care – PPO | Attending: Urology | Admitting: Urology

## 2020-03-15 ENCOUNTER — Encounter (HOSPITAL_BASED_OUTPATIENT_CLINIC_OR_DEPARTMENT_OTHER)
Admission: RE | Disposition: A | Discharge status: Home / Self Care | Payer: Self-pay | Source: Home / Self Care | Attending: Urology

## 2020-03-15 ENCOUNTER — Ambulatory Visit (HOSPITAL_COMMUNITY): Payer: BC Managed Care – PPO

## 2020-03-15 DIAGNOSIS — F17290 Nicotine dependence, other tobacco product, uncomplicated: Secondary | ICD-10-CM | POA: Diagnosis not present

## 2020-03-15 DIAGNOSIS — N281 Cyst of kidney, acquired: Secondary | ICD-10-CM | POA: Insufficient documentation

## 2020-03-15 DIAGNOSIS — E7800 Pure hypercholesterolemia, unspecified: Secondary | ICD-10-CM | POA: Diagnosis not present

## 2020-03-15 DIAGNOSIS — K589 Irritable bowel syndrome without diarrhea: Secondary | ICD-10-CM | POA: Insufficient documentation

## 2020-03-15 DIAGNOSIS — I10 Essential (primary) hypertension: Secondary | ICD-10-CM | POA: Insufficient documentation

## 2020-03-15 DIAGNOSIS — N200 Calculus of kidney: Secondary | ICD-10-CM | POA: Diagnosis not present

## 2020-03-15 HISTORY — PX: EXTRACORPOREAL SHOCK WAVE LITHOTRIPSY: SHX1557

## 2020-03-15 SURGERY — LITHOTRIPSY, ESWL
Anesthesia: LOCAL | Laterality: Right

## 2020-03-15 MED ORDER — DIAZEPAM 5 MG PO TABS
ORAL_TABLET | ORAL | Status: AC
  Filled 2020-03-15: qty 2

## 2020-03-15 MED ORDER — DIPHENHYDRAMINE HCL 25 MG PO CAPS
25.0000 mg | ORAL_CAPSULE | ORAL | Status: AC
  Administered 2020-03-15: 25 mg via ORAL
  Filled 2020-03-15: qty 1

## 2020-03-15 MED ORDER — CIPROFLOXACIN HCL 500 MG PO TABS
ORAL_TABLET | ORAL | Status: AC
  Filled 2020-03-15: qty 1

## 2020-03-15 MED ORDER — DIPHENHYDRAMINE HCL 25 MG PO CAPS
ORAL_CAPSULE | ORAL | Status: AC
  Filled 2020-03-15: qty 1

## 2020-03-15 MED ORDER — HYDROCODONE-ACETAMINOPHEN 5-325 MG PO TABS: 1.0000 | ORAL_TABLET | Freq: Four times a day (QID) | ORAL | 0 refills | Status: AC | PRN

## 2020-03-15 MED ORDER — CIPROFLOXACIN HCL 500 MG PO TABS
500.0000 mg | ORAL_TABLET | ORAL | Status: AC
  Administered 2020-03-15: 500 mg via ORAL
  Filled 2020-03-15: qty 1

## 2020-03-15 MED ORDER — DIAZEPAM 5 MG PO TABS
10.0000 mg | ORAL_TABLET | ORAL | Status: AC
  Administered 2020-03-15: 10 mg via ORAL
  Filled 2020-03-15: qty 2

## 2020-03-15 MED ORDER — SODIUM CHLORIDE 0.9 % IV SOLN
INTRAVENOUS | Status: DC
  Filled 2020-03-15: qty 1000

## 2020-03-15 NOTE — H&P (Signed)
CC/HPI: 1 - Gross Hematuria - blood on UA x several at PCP. Few gross episdoes. Few cigars per year. Nochronic solvent exposure. CT 2017 with small right renal stone. Repeat Eval 2021 with 49mm RLP stone, refused Cysto.   2 - Urolithiasis - incidetnal RLP 36mm non-obstruting stone on CT 2017. No prior colic.   3- Prostate Screening - No FHX prostate cancer. Undergoing "for cause" eval 02/2020 DRE 25gm, SPA 0.65   4- Right Renal Cyst - 2cm completely endophytic cyst indicental on CT 2017. No contrast studies for review. [place] 2017 suggests simple cyst.   PMH sig for anxiety/SSRI, IBS. 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 His PCP is Alysia Penna MD.   Today "Darren Calhoun" is seen in f/u above. INterval CT with progression of Rt lower pole stone, now about 53mm, no hydro.     ALLERGIES: No Allergies    MEDICATIONS: Exforge Hct  Zyrtec-D 5 mg-120 mg tablet, extended release 12 hr Oral     GU PSH: Locm 300-399Mg /Ml Iodine,1Ml - 02/27/2020     NON-GU PSH: None   GU PMH: Encounter for Prostate Cancer screening - 02/07/2020 Gross hematuria - 02/07/2020 Other microscopic hematuria, Microscopic hematuria - 2016    NON-GU PMH: Anxiety, Anxiety - 2016 Ganglion, unspecified site, Ganglion cyst - 2016 Lateral epicondylitis, unspecified elbow, Lateral epicondylitis - 2016 Personal history of other diseases of the circulatory system, History of hypertension - 2016 Personal history of other diseases of the digestive system, History of irritable bowel syndrome - 2016 Personal history of other diseases of the respiratory system, History of allergic rhinitis - 2016 Personal history of other endocrine, nutritional and metabolic disease, History of hyperlipidemia - 2016 Encounter for general adult medical examination without abnormal findings, Encounter for preventive health examination Hypercholesterolemia Hypertension    FAMILY HISTORY: 1 Daughter - Daughter 1 son - Son   SOCIAL  HISTORY: Marital Status: Married Preferred Language: English; Ethnicity: Not Hispanic Or Latino; Race: White Current Smoking Status: Patient smokes occasionally. Smokes less than 1/2 pack per day.   Tobacco Use Assessment Completed: Used Tobacco in last 30 days? Has never drank.  Drinks 3 caffeinated drinks per day. 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     Notes: 1-2 cigars per month   REVIEW OF SYSTEMS:    GU Review Male:   Patient denies frequent urination, hard to postpone urination, burning/ pain with urination, get up at night to urinate, leakage of urine, stream starts and stops, trouble starting your stream, have to strain to urinate , erection problems, and penile pain.  Gastrointestinal (Upper):   Patient denies nausea, vomiting, and indigestion/ heartburn.  Gastrointestinal (Lower):   Patient denies diarrhea and constipation.  Constitutional:   Patient denies fever, night sweats, weight loss, and fatigue.  Skin:   Patient denies skin rash/ lesion and itching.  Eyes:   Patient denies blurred vision and double vision.  Ears/ Nose/ Throat:   Patient denies sinus problems and sore throat.  Hematologic/Lymphatic:   Patient denies swollen glands and easy bruising.  Cardiovascular:   Patient denies leg swelling and chest pains.  Respiratory:   Patient denies cough and shortness of breath.  Endocrine:   Patient denies excessive thirst.  Musculoskeletal:   Patient denies back pain and joint pain.  Neurological:   Patient denies headaches and dizziness.  Psychologic:   Patient denies depression and anxiety.   VITAL SIGNS:      03/08/2020 03:31 PM  Weight 226 lb /  102.51 kg  Height 69 in / 175.26 cm  BP 142/85 mmHg  Pulse 96 /min  Temperature 98.7 F / 37.0 C  BMI 33.4 kg/m   GU PHYSICAL EXAMINATION:    Anus and Perineum: No hemorrhoids. No anal stenosis. No rectal fissure, no anal fissure. No edema, no dimple, no perineal tenderness, no anal tenderness.  Scrotum: No  lesions. No edema. No cysts. No warts.  Epididymides: Right: no spermatocele, no masses, no cysts, no tenderness, no induration, no enlargement. Left: no spermatocele, no masses, no cysts, no tenderness, no induration, no enlargement.  Testes: No tenderness, no swelling, no enlargement left testes. No tenderness, no swelling, no enlargement right testes. Normal location left testes. Normal location right testes. No mass, no cyst, no varicocele, no hydrocele left testes. No mass, no cyst, no varicocele, no hydrocele right testes.  Urethral Meatus: Normal size. No lesion, no wart, no discharge, no polyp. Normal location.  Penis: Circumcised, no warts, no cracks. No dorsal Peyronie's plaques, no left corporal Peyronie's plaques, no right corporal Peyronie's plaques, no scarring, no warts. No balanitis, no meatal stenosis.  Seminal Vesicles: Nonpalpable.   MULTI-SYSTEM PHYSICAL EXAMINATION:    Constitutional: Well-nourished. No physical deformities. Normally developed. Good grooming.  Neck: Neck symmetrical, not swollen. Normal tracheal position.  Respiratory: No labored breathing, no use of accessory muscles.   Cardiovascular: Normal temperature, normal extremity pulses, no swelling, no varicosities.  Lymphatic: No enlargement of neck, axillae, groin.  Skin: No paleness, no jaundice, no cyanosis. No lesion, no ulcer, no rash.  Neurologic / Psychiatric: Oriented to time, oriented to place, oriented to person. No depression, no anxiety, no agitation.  Gastrointestinal: No mass, no tenderness, no rigidity, non obese abdomen.  Eyes: Normal conjunctivae. Normal eyelids.  Ears, Nose, Mouth, and Throat: Left ear no scars, no lesions, no masses. Right ear no scars, no lesions, no masses. Nose no scars, no lesions, no masses. Normal hearing. Normal lips.  Musculoskeletal: Normal gait and station of head and neck.     PAST DATA REVIEWED:  Source Of History:  Patient  Records Review:   Previous Patient  Records  Urine Test Review:   Urinalysis  X-Ray Review: C.T. Abdomen/Pelvis: Reviewed Films. Reviewed Report. Discussed With Patient.     02/08/20  PSA  Total PSA 0.65 ng/mL    PROCEDURES:         KUB - 74018  A single view of the abdomen is obtained.      Patient confirmed No Neulasta OnPro Device.           Urinalysis w/Scope Dipstick Dipstick Cont'd Micro  Color: Yellow Bilirubin: Neg mg/dL WBC/hpf: NS (Not Seen)  Appearance: Clear Ketones: Neg mg/dL RBC/hpf: 10 - 14/HFW  Specific Gravity: 1.025 Blood: 2+ ery/uL Bacteria: NS (Not Seen)  pH: 6.0 Protein: Trace mg/dL Cystals: NS (Not Seen)  Glucose: Neg mg/dL Urobilinogen: 0.2 mg/dL Casts: NS (Not Seen)    Nitrites: Neg Trichomonas: Not Present    Leukocyte Esterase: Neg leu/uL Mucous: Not Present      Epithelial Cells: NS (Not Seen)      Yeast: NS (Not Seen)      Sperm: Not Present    ASSESSMENT:      ICD-10 Details  1 GU:   Encounter for Prostate Cancer screening - Z12.5 Chronic, Stable  2   Gross hematuria - R31.0 Chronic, Stable  3   Renal calculus - N20.0 Right, Chronic, Worsening   PLAN:  Orders X-Rays: KUB          Schedule         Document Letter(s):  Created for Patient: Clinical Summary         Notes:   Hematuria eval only with progressive size of Rt renal stone, size now likley much to large for medical passage. Options of observaiton v. reatemtn (prefer ureteroscopy given some ipsilateral urothelial enhacement to max r/o neoplasa) discussed. He opts for Rt SWL This is also reasonable. KUB today suggests more UPJ location.   CC: Gershon Crane MD.

## 2020-03-15 NOTE — Discharge Instructions (Signed)
Post Anesthesia Home Care Instructions  Activity: Get plenty of rest for the remainder of the day. A responsible adult should stay with you for 24 hours following the procedure.  For the next 24 hours, DO NOT: -Drive a car -Operate machinery -Drink alcoholic beverages -Take any medication unless instructed by your physician -Make any legal decisions or sign important papers.  Meals: Start with liquid foods such as gelatin or soup. Progress to regular foods as tolerated. Avoid greasy, spicy, heavy foods. If nausea and/or vomiting occur, drink only clear liquids until the nausea and/or vomiting subsides. Call your physician if vomiting continues.  Special Instructions/Symptoms: Your throat may feel dry or sore from the anesthesia or the breathing tube placed in your throat during surgery. If this causes discomfort, gargle with warm salt water. The discomfort should disappear within 24 hours.  If you had a scopolamine patch placed behind your ear for the management of post- operative nausea and/or vomiting:  1. The medication in the patch is effective for 72 hours, after which it should be removed.  Wrap patch in a tissue and discard in the trash. Wash hands thoroughly with soap and water. 2. You may remove the patch earlier than 72 hours if you experience unpleasant side effects which may include dry mouth, dizziness or visual disturbances. 3. Avoid touching the patch. Wash your hands with soap and water after contact with the patch.    Lithotripsy, Care After This sheet gives you information about how to care for yourself after your procedure. Your health care provider may also give you more specific instructions. If you have problems or questions, contact your health care provider. What can I expect after the procedure? After the procedure, it is common to have:  Some blood in your urine. This should only last for a few days.  Soreness in your back, sides, or upper abdomen for a few  days.  Blotches or bruises on your back where the pressure wave entered the skin.  Pain, discomfort, or nausea when pieces (fragments) of the kidney stone move through the tube that carries urine from the kidney to the bladder (ureter). Stone fragments may pass soon after the procedure, but they may continue to pass for up to 4-8 weeks. ? If you have severe pain or nausea, contact your health care provider. This may be caused by a large stone that was not broken up, and this may mean that you need more treatment.  Some pain or discomfort during urination.  Some pain or discomfort in the lower abdomen or (in men) at the base of the penis. Follow these instructions at home: Medicines  Take over-the-counter and prescription medicines only as told by your health care provider.  If you were prescribed an antibiotic medicine, take it as told by your health care provider. Do not stop taking the antibiotic even if you start to feel better.  Do not drive for 24 hours if you were given a medicine to help you relax (sedative).  Do not drive or use heavy machinery while taking prescription pain medicine. Eating and drinking      Drink enough water and fluids to keep your urine clear or pale yellow. This helps any remaining pieces of the stone to pass. It can also help prevent new stones from forming.  Eat plenty of fresh fruits and vegetables.  Follow instructions from your health care provider about eating and drinking restrictions. You may be instructed: ? To reduce how much salt (sodium) you   eat or drink. Check ingredients and nutrition facts on packaged foods and beverages. ? To reduce how much meat you eat.  Eat the recommended amount of calcium for your age and gender. Ask your health care provider how much calcium you should have. General instructions  Get plenty of rest.  Most people can resume normal activities 1-2 days after the procedure. Ask your health care provider what  activities are safe for you.  Your health care provider may direct you to lie in a certain position (postural drainage) and tap firmly (percuss) over your kidney area to help stone fragments pass. Follow instructions as told by your health care provider.  If directed, strain all urine through the strainer that was provided by your health care provider. ? Keep all fragments for your health care provider to see. Any stones that are found may be sent to a medical lab for examination. The stone may be as small as a grain of salt.  Keep all follow-up visits as told by your health care provider. This is important. Contact a health care provider if:  You have pain that is severe or does not get better with medicine.  You have nausea that is severe or does not go away.  You have blood in your urine longer than your health care provider told you to expect.  You have more blood in your urine.  You have pain during urination that does not go away.  You urinate more frequently than usual and this does not go away.  You develop a rash or any other possible signs of an allergic reaction. Get help right away if:  You have severe pain in your back, sides, or upper abdomen.  You have severe pain while urinating.  Your urine is very dark red.  You have blood in your stool (feces).  You cannot pass any urine at all.  You feel a strong urge to urinate after emptying your bladder.  You have a fever or chills.  You develop shortness of breath, difficulty breathing, or chest pain.  You have severe nausea that leads to persistent vomiting.  You faint. Summary  After this procedure, it is common to have some pain, discomfort, or nausea when pieces (fragments) of the kidney stone move through the tube that carries urine from the kidney to the bladder (ureter). If this pain or nausea is severe, however, you should contact your health care provider.  Most people can resume normal activities 1-2  days after the procedure. Ask your health care provider what activities are safe for you.  Drink enough water and fluids to keep your urine clear or pale yellow. This helps any remaining pieces of the stone to pass, and it can help prevent new stones from forming.  If directed, strain your urine and keep all fragments for your health care provider to see. Fragments or stones may be as small as a grain of salt.  Get help right away if you have severe pain in your back, sides, or upper abdomen or have severe pain while urinating. This information is not intended to replace advice given to you by your health care provider. Make sure you discuss any questions you have with your health care provider. Document Revised: 03/07/2019 Document Reviewed: 10/15/2016 Elsevier Patient Education  2020 [employer] See [facility] discharge instructions in chart. 

## 2020-03-15 NOTE — Op Note (Signed)
See [facility] OP note scanned into chart. Also because of the size, density, location and other factors that cannot be anticipated I feel this will likely be a staged procedure. This fact supersedes any indication in the scanned [facility] operative note to the contrary.  

## 2020-05-17 ENCOUNTER — Ambulatory Visit (INDEPENDENT_AMBULATORY_CARE_PROVIDER_SITE_OTHER): Payer: BC Managed Care – PPO | Admitting: Family Medicine

## 2020-05-17 ENCOUNTER — Encounter: Payer: Self-pay | Admitting: Family Medicine

## 2020-05-17 ENCOUNTER — Other Ambulatory Visit: Payer: Self-pay

## 2020-05-17 VITALS — BP 124/80 | HR 90 | Temp 97.9°F | Wt 230.6 lb

## 2020-05-17 DIAGNOSIS — N390 Urinary tract infection, site not specified: Secondary | ICD-10-CM | POA: Diagnosis not present

## 2020-05-17 DIAGNOSIS — R300 Dysuria: Secondary | ICD-10-CM | POA: Diagnosis not present

## 2020-05-17 DIAGNOSIS — R109 Unspecified abdominal pain: Secondary | ICD-10-CM | POA: Diagnosis not present

## 2020-05-17 LAB — POCT URINALYSIS DIPSTICK
Bilirubin, UA: NEGATIVE
Blood, UA: POSITIVE
Glucose, UA: NEGATIVE
Ketones, UA: NEGATIVE
Nitrite, UA: NEGATIVE
Protein, UA: POSITIVE — AB
Spec Grav, UA: >=1.03 — AB (ref 1.010–1.025)
Urobilinogen, UA: NEGATIVE E.U./dL — AB
pH, UA: 6 (ref 5.0–8.0)

## 2020-05-17 MED ORDER — SULFAMETHOXAZOLE-TRIMETHOPRIM 800-160 MG PO TABS: 1.0000 | ORAL_TABLET | Freq: Two times a day (BID) | ORAL | 0 refills | Status: DC

## 2020-05-17 MED ORDER — KETOROLAC TROMETHAMINE 60 MG/2ML IM SOLN
60.0000 mg | Freq: Once | INTRAMUSCULAR | Status: AC
  Administered 2020-05-17: 60 mg via INTRAMUSCULAR

## 2020-05-17 MED ORDER — TRAMADOL HCL 50 MG PO TABS: 100.0000 mg | ORAL_TABLET | Freq: Four times a day (QID) | ORAL | 0 refills | Status: DC | PRN

## 2020-05-17 MED ORDER — PROMETHAZINE HCL 25 MG PO TABS: 25.0000 mg | ORAL_TABLET | ORAL | 0 refills | Status: DC | PRN

## 2020-05-17 NOTE — Progress Notes (Signed)
   Subjective:    Patient ID: Darren Calhoun, male    DOB: 02-14-1971, 49 y.o.   MRN: [id number]  HPI Here for the onset this morning of severe right sided flank and mid back pain, along with increased frequency of urination. No fever. He is nauseated but ha snot vomited. Of note he had lithotripsy on 03-15-20 to treat a 7 mm stone in the right kidney.    Review of Systems  Constitutional: Negative.   Respiratory: Negative.   Cardiovascular: Negative.   Gastrointestinal: Negative.   Genitourinary: Positive for flank pain and frequency. Negative for dysuria, hematuria and urgency.  Musculoskeletal: Positive for back pain.       Objective:   Physical Exam Constitutional:      Comments: He is in pain, bent over at the waist   Cardiovascular:     Rate and Rhythm: Normal rate and regular rhythm.     Pulses: Normal pulses.     Heart sounds: Normal heart sounds.  Pulmonary:     Effort: Pulmonary effort is normal.     Breath sounds: Normal breath sounds.  Abdominal:     General: Abdomen is flat. Bowel sounds are normal. There is no distension.     Palpations: Abdomen is soft. There is no mass.     Tenderness: There is no abdominal tenderness. There is no guarding or rebound.     Hernia: No hernia is present.  Neurological:     Mental Status: He is alert.           Assessment & Plan:  UTI with a possible right kidney stone. Given a shot of Toradol. Treat with Bactrim DS for 10 days. Use Tramadol for pain and Phenergan for nausea. Culture the sample. Set up a CT of the abdomen and pelvis to rule out a stone.  Gershon Crane, MD

## 2020-05-18 ENCOUNTER — Encounter: Payer: Self-pay | Admitting: Family Medicine

## 2020-05-18 LAB — URINE CULTURE
MICRO NUMBER:: 10576503
Result:: NO GROWTH
SPECIMEN QUALITY:: ADEQUATE

## 2020-06-19 ENCOUNTER — Encounter: Payer: Self-pay | Admitting: Family Medicine

## 2020-10-15 ENCOUNTER — Other Ambulatory Visit: Payer: Self-pay | Admitting: Family Medicine

## 2020-10-19 ENCOUNTER — Other Ambulatory Visit: Payer: Self-pay | Admitting: Family Medicine

## 2020-10-19 DIAGNOSIS — Z0000 Encounter for general adult medical examination without abnormal findings: Secondary | ICD-10-CM

## 2020-10-19 NOTE — Telephone Encounter (Signed)
Last OV 05/17/20 Last fill 10/18/19  #90/3

## 2020-11-17 ENCOUNTER — Other Ambulatory Visit: Payer: Self-pay | Admitting: Family Medicine

## 2021-06-18 ENCOUNTER — Other Ambulatory Visit: Payer: Self-pay

## 2021-06-19 ENCOUNTER — Ambulatory Visit (INDEPENDENT_AMBULATORY_CARE_PROVIDER_SITE_OTHER): Payer: BC Managed Care – PPO | Admitting: Family Medicine

## 2021-06-19 ENCOUNTER — Encounter: Payer: Self-pay | Admitting: Family Medicine

## 2021-06-19 VITALS — BP 132/92 | HR 81 | Temp 98.0°F | Ht 68.0 in | Wt 226.0 lb

## 2021-06-19 DIAGNOSIS — Z0000 Encounter for general adult medical examination without abnormal findings: Secondary | ICD-10-CM | POA: Diagnosis not present

## 2021-06-19 LAB — CBC WITH DIFFERENTIAL/PLATELET
Basophils Absolute: 0.1 10*3/uL (ref 0.0–0.1)
Basophils Relative: 1 % (ref 0.0–3.0)
Eosinophils Absolute: 0.3 10*3/uL (ref 0.0–0.7)
Eosinophils Relative: 4 % (ref 0.0–5.0)
HCT: 44.4 % (ref 39.0–52.0)
Hemoglobin: 15.5 g/dL (ref 13.0–17.0)
Lymphocytes Relative: 33.8 % (ref 12.0–46.0)
Lymphs Abs: 2.3 10*3/uL (ref 0.7–4.0)
MCHC: 34.9 g/dL (ref 30.0–36.0)
MCV: 86.9 fl (ref 78.0–100.0)
Monocytes Absolute: 0.7 10*3/uL (ref 0.1–1.0)
Monocytes Relative: 10.3 % (ref 3.0–12.0)
Neutro Abs: 3.5 10*3/uL (ref 1.4–7.7)
Neutrophils Relative %: 50.9 % (ref 43.0–77.0)
Platelets: 253 10*3/uL (ref 150.0–400.0)
RBC: 5.1 Mil/uL (ref 4.22–5.81)
RDW: 12.8 % (ref 11.5–15.5)
WBC: 6.9 10*3/uL (ref 4.0–10.5)

## 2021-06-19 LAB — LDL CHOLESTEROL, DIRECT: Direct LDL: 125 mg/dL

## 2021-06-19 LAB — HEPATIC FUNCTION PANEL
ALT: 73 U/L — ABNORMAL HIGH (ref 0–53)
AST: 42 U/L — ABNORMAL HIGH (ref 0–37)
Albumin: 4.5 g/dL (ref 3.5–5.2)
Alkaline Phosphatase: 57 U/L (ref 39–117)
Bilirubin, Direct: 0.1 mg/dL (ref 0.0–0.3)
Total Bilirubin: 0.5 mg/dL (ref 0.2–1.2)
Total Protein: 7.2 g/dL (ref 6.0–8.3)

## 2021-06-19 LAB — T3, FREE: T3, Free: 4.2 pg/mL (ref 2.3–4.2)

## 2021-06-19 LAB — URINALYSIS, ROUTINE W REFLEX MICROSCOPIC
Bilirubin Urine: NEGATIVE
Ketones, ur: NEGATIVE
Leukocytes,Ua: NEGATIVE
Nitrite: NEGATIVE
Specific Gravity, Urine: 1.02 (ref 1.000–1.030)
Total Protein, Urine: NEGATIVE
Urine Glucose: NEGATIVE
Urobilinogen, UA: 0.2 (ref 0.0–1.0)
pH: 6 (ref 5.0–8.0)

## 2021-06-19 LAB — BASIC METABOLIC PANEL
BUN: 13 mg/dL (ref 6–23)
CO2: 26 mEq/L (ref 19–32)
Calcium: 9.5 mg/dL (ref 8.4–10.5)
Chloride: 102 mEq/L (ref 96–112)
Creatinine, Ser: 1.02 mg/dL (ref 0.40–1.50)
GFR: 86.96 mL/min (ref >60.00)
Glucose, Bld: 131 mg/dL — ABNORMAL HIGH (ref 70–99)
Potassium: 4.1 mEq/L (ref 3.5–5.1)
Sodium: 138 mEq/L (ref 135–145)

## 2021-06-19 LAB — HEMOGLOBIN A1C: Hgb A1c MFr Bld: 8 % — ABNORMAL HIGH (ref 4.6–6.5)

## 2021-06-19 LAB — TSH: TSH: 2.72 u[IU]/mL (ref 0.35–5.50)

## 2021-06-19 LAB — LIPID PANEL
Cholesterol: 202 mg/dL — ABNORMAL HIGH (ref 0–200)
HDL: 35.2 mg/dL — ABNORMAL LOW (ref >39.00)
NonHDL: 166.94
Total CHOL/HDL Ratio: 6
Triglycerides: 207 mg/dL — ABNORMAL HIGH (ref 0.0–149.0)
VLDL: 41.4 mg/dL — ABNORMAL HIGH (ref 0.0–40.0)

## 2021-06-19 LAB — T4, FREE: Free T4: 0.64 ng/dL (ref 0.60–1.60)

## 2021-06-19 MED ORDER — SERTRALINE HCL 100 MG PO TABS: 100.0000 mg | ORAL_TABLET | Freq: Every day | ORAL | 3 refills | Status: DC

## 2021-06-19 MED ORDER — AMLODIPINE BESYLATE-VALSARTAN 10-320 MG PO TABS: 1.0000 | ORAL_TABLET | Freq: Every day | ORAL | 3 refills | Status: DC

## 2021-06-19 NOTE — Progress Notes (Signed)
   Subjective:    Patient ID: Darren Calhoun, male    DOB: 1971-08-06, 50 y.o.   MRN: [id number]  HPI Here for a well exam. He feels great.    Review of Systems  Constitutional: Negative.   HENT: Negative.    Eyes: Negative.   Respiratory: Negative.    Cardiovascular: Negative.   Gastrointestinal: Negative.   Genitourinary: Negative.   Musculoskeletal: Negative.   Skin: Negative.   Neurological: Negative.   Psychiatric/Behavioral: Negative.        Objective:   Physical Exam Constitutional:      General: He is not in acute distress.    Appearance: Normal appearance. He is well-developed. He is not diaphoretic.  HENT:     Head: Normocephalic and atraumatic.     Right Ear: External ear normal.     Left Ear: External ear normal.     Nose: Nose normal.     Mouth/Throat:     Pharynx: No oropharyngeal exudate.  Eyes:     General: No scleral icterus.       Right eye: No discharge.        Left eye: No discharge.     Conjunctiva/sclera: Conjunctivae normal.     Pupils: Pupils are equal, round, and reactive to light.  Neck:     Thyroid: No thyromegaly.     Vascular: No JVD.     Trachea: No tracheal deviation.  Cardiovascular:     Rate and Rhythm: Normal rate and regular rhythm.     Heart sounds: Normal heart sounds. No murmur heard.   No friction rub. No gallop.  Pulmonary:     Effort: Pulmonary effort is normal. No respiratory distress.     Breath sounds: Normal breath sounds. No wheezing or rales.  Chest:     Chest wall: No tenderness.  Abdominal:     General: Bowel sounds are normal. There is no distension.     Palpations: Abdomen is soft. There is no mass.     Tenderness: There is no abdominal tenderness. There is no guarding or rebound.  Genitourinary:    Penis: Normal. No tenderness.      Testes: Normal.  Musculoskeletal:        General: No tenderness. Normal range of motion.     Cervical back: Neck supple.  Lymphadenopathy:     Cervical: No cervical  adenopathy.  Skin:    General: Skin is warm and dry.     Coloration: Skin is not pale.     Findings: No erythema or rash.  Neurological:     Mental Status: He is alert and oriented to person, place, and time.     Cranial Nerves: No cranial nerve deficit.     Motor: No abnormal muscle tone.     Coordination: Coordination normal.     Deep Tendon Reflexes: Reflexes are normal and symmetric. Reflexes normal.  Psychiatric:        Behavior: Behavior normal.        Thought Content: Thought content normal.        Judgment: Judgment normal.          Assessment & Plan:  Well exam. We discussed diet and exercise. Get fasting labs.  Gershon Crane, MD

## 2021-06-20 ENCOUNTER — Other Ambulatory Visit: Payer: Self-pay

## 2021-06-20 DIAGNOSIS — E119 Type 2 diabetes mellitus without complications: Secondary | ICD-10-CM

## 2021-06-20 DIAGNOSIS — R748 Abnormal levels of other serum enzymes: Secondary | ICD-10-CM

## 2021-06-20 DIAGNOSIS — E785 Hyperlipidemia, unspecified: Secondary | ICD-10-CM

## 2021-06-20 MED ORDER — METFORMIN HCL 500 MG PO TABS: 500.0000 mg | ORAL_TABLET | Freq: Two times a day (BID) | ORAL | 5 refills | Status: DC

## 2021-07-15 ENCOUNTER — Encounter: Payer: Self-pay | Admitting: Family Medicine

## 2021-07-16 ENCOUNTER — Other Ambulatory Visit: Payer: Self-pay

## 2021-07-16 MED ORDER — CETIRIZINE-PSEUDOEPHEDRINE ER 5-120 MG PO TB12: 1.0000 | ORAL_TABLET | Freq: Two times a day (BID) | ORAL | 2 refills | Status: DC

## 2021-07-30 LAB — HM DIABETES EYE EXAM

## 2021-08-07 ENCOUNTER — Encounter: Payer: Self-pay | Admitting: Family Medicine

## 2021-08-21 ENCOUNTER — Other Ambulatory Visit (INDEPENDENT_AMBULATORY_CARE_PROVIDER_SITE_OTHER): Payer: Self-pay

## 2021-08-21 ENCOUNTER — Other Ambulatory Visit: Payer: Self-pay

## 2021-08-21 DIAGNOSIS — E119 Type 2 diabetes mellitus without complications: Secondary | ICD-10-CM

## 2021-08-21 DIAGNOSIS — R748 Abnormal levels of other serum enzymes: Secondary | ICD-10-CM

## 2021-08-21 DIAGNOSIS — E785 Hyperlipidemia, unspecified: Secondary | ICD-10-CM

## 2021-08-21 LAB — LIPID PANEL
Cholesterol: 174 mg/dL (ref 0–200)
HDL: 32.5 mg/dL — ABNORMAL LOW (ref >39.00)
LDL Cholesterol: 105 mg/dL — ABNORMAL HIGH (ref 0–99)
NonHDL: 141.93
Total CHOL/HDL Ratio: 5
Triglycerides: 186 mg/dL — ABNORMAL HIGH (ref 0.0–149.0)
VLDL: 37.2 mg/dL (ref 0.0–40.0)

## 2021-08-21 LAB — HEPATIC FUNCTION PANEL
ALT: 44 U/L (ref 0–53)
AST: 23 U/L (ref 0–37)
Albumin: 4.4 g/dL (ref 3.5–5.2)
Alkaline Phosphatase: 53 U/L (ref 39–117)
Bilirubin, Direct: 0.1 mg/dL (ref 0.0–0.3)
Total Bilirubin: 0.5 mg/dL (ref 0.2–1.2)
Total Protein: 7.1 g/dL (ref 6.0–8.3)

## 2021-08-21 LAB — HEMOGLOBIN A1C: Hgb A1c MFr Bld: 6.6 % — ABNORMAL HIGH (ref 4.6–6.5)

## 2021-10-29 ENCOUNTER — Other Ambulatory Visit: Payer: Self-pay | Admitting: Family Medicine

## 2021-11-19 ENCOUNTER — Other Ambulatory Visit: Payer: Self-pay

## 2021-11-19 MED ORDER — CETIRIZINE-PSEUDOEPHEDRINE ER 5-120 MG PO TB12: 1.0000 | ORAL_TABLET | Freq: Two times a day (BID) | ORAL | 2 refills | Status: DC

## 2022-02-06 ENCOUNTER — Other Ambulatory Visit: Payer: Self-pay | Admitting: Family Medicine

## 2022-05-07 ENCOUNTER — Other Ambulatory Visit: Payer: Self-pay | Admitting: Family Medicine

## 2022-05-07 DIAGNOSIS — E119 Type 2 diabetes mellitus without complications: Secondary | ICD-10-CM

## 2022-09-15 ENCOUNTER — Other Ambulatory Visit: Payer: Self-pay

## 2022-09-15 ENCOUNTER — Telehealth: Payer: Self-pay | Admitting: Family Medicine

## 2022-09-15 DIAGNOSIS — Z0000 Encounter for general adult medical examination without abnormal findings: Secondary | ICD-10-CM

## 2022-09-15 DIAGNOSIS — E119 Type 2 diabetes mellitus without complications: Secondary | ICD-10-CM

## 2022-09-15 DIAGNOSIS — I10 Essential (primary) hypertension: Secondary | ICD-10-CM

## 2022-09-15 DIAGNOSIS — F411 Generalized anxiety disorder: Secondary | ICD-10-CM

## 2022-09-15 MED ORDER — SERTRALINE HCL 100 MG PO TABS: 100.0000 mg | ORAL_TABLET | Freq: Every day | ORAL | 0 refills | Status: DC

## 2022-09-15 MED ORDER — AMLODIPINE BESYLATE-VALSARTAN 10-320 MG PO TABS: 1.0000 | ORAL_TABLET | Freq: Every day | ORAL | 0 refills | Status: DC

## 2022-09-15 MED ORDER — METFORMIN HCL 500 MG PO TABS: 500.0000 mg | ORAL_TABLET | Freq: Two times a day (BID) | ORAL | 0 refills | Status: DC

## 2022-09-15 NOTE — Telephone Encounter (Signed)
Called patient 30 day supply of requested medications sent to [facility] on [address].

## 2022-09-15 NOTE — Telephone Encounter (Signed)
Refill metFORMIN (GLUCOPHAGE) 500 MG tablet, sertraline (ZOLOFT) 100 MG tablet and amLODipine-valsartan (EXFORGE) 10-320 MG per tablet has cpe scheduled for 10/29/22

## 2022-10-29 ENCOUNTER — Encounter: Payer: Self-pay | Admitting: Family Medicine

## 2022-11-05 ENCOUNTER — Encounter: Payer: Self-pay | Admitting: Family Medicine

## 2022-11-05 ENCOUNTER — Ambulatory Visit (INDEPENDENT_AMBULATORY_CARE_PROVIDER_SITE_OTHER): Payer: Self-pay | Admitting: Family Medicine

## 2022-11-05 ENCOUNTER — Telehealth: Payer: Self-pay

## 2022-11-05 VITALS — BP 130/84 | HR 91 | Temp 97.9°F | Ht 68.0 in | Wt 224.0 lb

## 2022-11-05 DIAGNOSIS — Z1211 Encounter for screening for malignant neoplasm of colon: Secondary | ICD-10-CM

## 2022-11-05 DIAGNOSIS — K589 Irritable bowel syndrome without diarrhea: Secondary | ICD-10-CM

## 2022-11-05 DIAGNOSIS — N138 Other obstructive and reflux uropathy: Secondary | ICD-10-CM

## 2022-11-05 DIAGNOSIS — E119 Type 2 diabetes mellitus without complications: Secondary | ICD-10-CM

## 2022-11-05 DIAGNOSIS — F411 Generalized anxiety disorder: Secondary | ICD-10-CM

## 2022-11-05 DIAGNOSIS — E785 Hyperlipidemia, unspecified: Secondary | ICD-10-CM

## 2022-11-05 DIAGNOSIS — N401 Enlarged prostate with lower urinary tract symptoms: Secondary | ICD-10-CM

## 2022-11-05 DIAGNOSIS — F419 Anxiety disorder, unspecified: Secondary | ICD-10-CM

## 2022-11-05 DIAGNOSIS — I10 Essential (primary) hypertension: Secondary | ICD-10-CM

## 2022-11-05 LAB — CBC WITH DIFFERENTIAL/PLATELET
Basophils Absolute: 0.1 10*3/uL (ref 0.0–0.1)
Basophils Relative: 0.8 % (ref 0.0–3.0)
Eosinophils Absolute: 0.3 10*3/uL (ref 0.0–0.7)
Eosinophils Relative: 4.2 % (ref 0.0–5.0)
HCT: 47.6 % (ref 39.0–52.0)
Hemoglobin: 16.5 g/dL (ref 13.0–17.0)
Lymphocytes Relative: 27.2 % (ref 12.0–46.0)
Lymphs Abs: 2.1 10*3/uL (ref 0.7–4.0)
MCHC: 34.6 g/dL (ref 30.0–36.0)
MCV: 86.7 fl (ref 78.0–100.0)
Monocytes Absolute: 0.9 10*3/uL (ref 0.1–1.0)
Monocytes Relative: 12 % (ref 3.0–12.0)
Neutro Abs: 4.3 10*3/uL (ref 1.4–7.7)
Neutrophils Relative %: 55.8 % (ref 43.0–77.0)
Platelets: 284 10*3/uL (ref 150.0–400.0)
RBC: 5.48 Mil/uL (ref 4.22–5.81)
RDW: 13.4 % (ref 11.5–15.5)
WBC: 7.7 10*3/uL (ref 4.0–10.5)

## 2022-11-05 LAB — HEPATIC FUNCTION PANEL
ALT: 27 U/L (ref 0–53)
AST: 17 U/L (ref 0–37)
Albumin: 4.4 g/dL (ref 3.5–5.2)
Alkaline Phosphatase: 46 U/L (ref 39–117)
Bilirubin, Direct: 0.1 mg/dL (ref 0.0–0.3)
Total Bilirubin: 0.4 mg/dL (ref 0.2–1.2)
Total Protein: 7.1 g/dL (ref 6.0–8.3)

## 2022-11-05 LAB — TSH: TSH: 2.71 u[IU]/mL (ref 0.35–5.50)

## 2022-11-05 LAB — LIPID PANEL
Cholesterol: 214 mg/dL — ABNORMAL HIGH (ref 0–200)
HDL: 33.6 mg/dL — ABNORMAL LOW (ref >39.00)
NonHDL: 180.14
Total CHOL/HDL Ratio: 6
Triglycerides: 226 mg/dL — ABNORMAL HIGH (ref 0.0–149.0)
VLDL: 45.2 mg/dL — ABNORMAL HIGH (ref 0.0–40.0)

## 2022-11-05 LAB — BASIC METABOLIC PANEL
BUN: 19 mg/dL (ref 6–23)
CO2: 31 mEq/L (ref 19–32)
Calcium: 9.2 mg/dL (ref 8.4–10.5)
Chloride: 101 mEq/L (ref 96–112)
Creatinine, Ser: 1.05 mg/dL (ref 0.40–1.50)
GFR: 83.18 mL/min (ref >60.00)
Glucose, Bld: 112 mg/dL — ABNORMAL HIGH (ref 70–99)
Potassium: 4.4 mEq/L (ref 3.5–5.1)
Sodium: 138 mEq/L (ref 135–145)

## 2022-11-05 LAB — LDL CHOLESTEROL, DIRECT: Direct LDL: 129 mg/dL

## 2022-11-05 LAB — PSA: PSA: 0.83 ng/mL (ref 0.10–4.00)

## 2022-11-05 LAB — HEMOGLOBIN A1C: Hgb A1c MFr Bld: 6.7 % — ABNORMAL HIGH (ref 4.6–6.5)

## 2022-11-05 MED ORDER — SERTRALINE HCL 100 MG PO TABS: 100.0000 mg | ORAL_TABLET | Freq: Every day | ORAL | 3 refills | Status: DC

## 2022-11-05 MED ORDER — FLUTICASONE PROPIONATE 50 MCG/ACT NA SUSP: NASAL | 11 refills | Status: AC

## 2022-11-05 MED ORDER — CETIRIZINE-PSEUDOEPHEDRINE ER 5-120 MG PO TB12: 1.0000 | ORAL_TABLET | Freq: Two times a day (BID) | ORAL | 3 refills | Status: DC

## 2022-11-05 MED ORDER — AMLODIPINE BESYLATE-VALSARTAN 10-320 MG PO TABS: 1.0000 | ORAL_TABLET | Freq: Every day | ORAL | 3 refills | Status: DC

## 2022-11-05 MED ORDER — METFORMIN HCL 500 MG PO TABS: 500.0000 mg | ORAL_TABLET | Freq: Every day | ORAL | 3 refills | Status: DC

## 2022-11-05 NOTE — Progress Notes (Signed)
Subjective:    Patient ID: Darren Calhoun, male    DOB: Nov 13, 1971, 51 y.o.   MRN: [id number]  HPI Here to follow up on issues. He feels well. His BP is stable. His anxiety and IBS are stable. He has radically changed his diet  and his glucoses have been much lower lately. His am fasting glucoses average from 100 to 110. He has even decreased the Metformin to taking it only once a day.    Review of Systems  Constitutional: Negative.   HENT: Negative.    Eyes: Negative.   Respiratory: Negative.    Cardiovascular: Negative.   Gastrointestinal: Negative.   Genitourinary: Negative.   Musculoskeletal: Negative.   Skin: Negative.   Neurological: Negative.   Psychiatric/Behavioral: Negative.         Objective:   Physical Exam Constitutional:      General: He is not in acute distress.    Appearance: Normal appearance. He is well-developed. He is not diaphoretic.  HENT:     Head: Normocephalic and atraumatic.     Right Ear: External ear normal.     Left Ear: External ear normal.     Nose: Nose normal.     Mouth/Throat:     Pharynx: No oropharyngeal exudate.  Eyes:     General: No scleral icterus.       Right eye: No discharge.        Left eye: No discharge.     Conjunctiva/sclera: Conjunctivae normal.     Pupils: Pupils are equal, round, and reactive to light.  Neck:     Thyroid: No thyromegaly.     Vascular: No JVD.     Trachea: No tracheal deviation.  Cardiovascular:     Rate and Rhythm: Normal rate and regular rhythm.     Heart sounds: Normal heart sounds. No murmur heard.    No friction rub. No gallop.  Pulmonary:     Effort: Pulmonary effort is normal. No respiratory distress.     Breath sounds: Normal breath sounds. No wheezing or rales.  Chest:     Chest wall: No tenderness.  Abdominal:     General: Bowel sounds are normal. There is no distension.     Palpations: Abdomen is soft. There is no mass.     Tenderness: There is no abdominal tenderness. There is no  guarding or rebound.  Genitourinary:    Penis: Normal. No tenderness.      Testes: Normal.     Prostate: Normal.     Rectum: Normal. Guaiac result negative.  Musculoskeletal:        General: No tenderness. Normal range of motion.     Cervical back: Neck supple.  Lymphadenopathy:     Cervical: No cervical adenopathy.  Skin:    General: Skin is warm and dry.     Coloration: Skin is not pale.     Findings: No erythema or rash.  Neurological:     Mental Status: He is alert and oriented to person, place, and time.     Cranial Nerves: No cranial nerve deficit.     Motor: No abnormal muscle tone.     Coordination: Coordination normal.     Deep Tendon Reflexes: Reflexes are normal and symmetric. Reflexes normal.  Psychiatric:        Behavior: Behavior normal.        Thought Content: Thought content normal.        Judgment: Judgment normal.  Assessment & Plan:  He is doing well overall. His diabetes and HTN are well controlled. His IBS and anxiety are stable. We will get fasting labs to check lipids, an A1c, etc. We will set up his first colonoscopy.  Gershon Crane, MD

## 2022-11-05 NOTE — Telephone Encounter (Signed)
Caller states he has a physical on Wednesday but he is having a kidney stone issues, he can feel it moving to his bladder. Caller states he wants to know if he needs to be seen before Wednesday. Caller states he is having sharp pain and blood in his urine  11/03/2022 9:13:28 AM See HCP within 4 Hours (or PCP triage) Doylene Canard, RN, Lesa  Comments User: Ocie Doyne, RN Date/Time Darren Calhoun Time): 11/03/2022 9:20:35 AM I am unable to reach the backline. Caller notified. Verbalizes understanding  Referrals REFERRED TO PCP OFFICE  Pt has appt with PCP for CPE on 11/05/22  Short time later at 0940 pt's spouse sent to triage line. See note below: --- The caller has just spoken with a nurse 50 minutes ago and gave the outcome to be seen in within 4 hours and the only appointment was further out than that and the office sent the call back to the call center. This nurse explained that the office uses our triage to have the patient to be seen in a timely manner and they didn't have anything within that outcome time frame. This nurse asked if she would like for this nurse to find her husband somewhere to go to be seen within that time frame and she declined. She states that they will just take the later time at the office. This nurse states that triage nurses go by what the outcome says for safety reasons. This nurse is still not sure that the caller understands. She states that if her husband's symptoms worsen that they will go to the ED.

## 2023-01-15 ENCOUNTER — Ambulatory Visit (INDEPENDENT_AMBULATORY_CARE_PROVIDER_SITE_OTHER): Payer: Medicaid Other | Admitting: Family Medicine

## 2023-01-15 ENCOUNTER — Encounter: Payer: Self-pay | Admitting: Family Medicine

## 2023-01-15 VITALS — BP 120/82 | HR 84 | Temp 98.2°F | Wt 220.0 lb

## 2023-01-15 DIAGNOSIS — E65 Localized adiposity: Secondary | ICD-10-CM | POA: Diagnosis not present

## 2023-01-15 DIAGNOSIS — I10 Essential (primary) hypertension: Secondary | ICD-10-CM | POA: Diagnosis not present

## 2023-01-15 MED ORDER — AMLODIPINE BESYLATE-VALSARTAN 5-160 MG PO TABS: 1.0000 | ORAL_TABLET | Freq: Every day | ORAL | 5 refills | Status: DC

## 2023-01-15 NOTE — Progress Notes (Signed)
   Subjective:    Patient ID: Darren Calhoun, male    DOB: 28-Feb-1971, 52 y.o.   MRN: [id number]  HPI Here for 2 issues. First he wants [place] to check a "bump" on the left arm that he noticed several weeks ago. This is not changing in size. It is not painful. When he had labs drawn here on 11-06-23, the venopuncture was difficult and he had significant bruising in the left antecubital area with a small hematoma forming. After all this resolved. He began noticing the "bump". The other issue is his BP. He is taking ExForge 10-320 daily for HTN, but he says after taking this for a week,. His BP will drop too low, sometimes as low as 100/60. He will then stop taking the medication for a few days, and resume taking it when the BP jumps up to 814 systolic. He feels fine, no headaches or SOB.    Review of Systems  Constitutional: Negative.   Respiratory: Negative.    Cardiovascular: Negative.        Objective:   Physical Exam Constitutional:      Appearance: Normal appearance.  Cardiovascular:     Rate and Rhythm: Normal rate and regular rhythm.     Pulses: Normal pulses.     Heart sounds: Normal heart sounds.  Pulmonary:     Effort: Pulmonary effort is normal.     Breath sounds: Normal breath sounds.  Musculoskeletal:     Right lower leg: No edema.     Left lower leg: No edema.     Comments: Looking at his antecubital areas, the area he points to is a small area of fat deposition under the skin. The one on the left is slightly more noticeable than the one on the right. This is not tender.   Neurological:     Mental Status: He is alert.           Assessment & Plan:  The "bump" on the left arm is simply an area of fat deposition under the skin, and I explained that this is benign. As for the HTN, we will decrease the ExForge to the 5-160 dose, and I urgedhim to take this every day. He will report back in a few weeks.  Alysia Penna, MD

## 2023-06-09 ENCOUNTER — Encounter: Payer: Self-pay | Admitting: Family Medicine

## 2023-06-09 ENCOUNTER — Ambulatory Visit (INDEPENDENT_AMBULATORY_CARE_PROVIDER_SITE_OTHER): Payer: Medicaid Other | Admitting: Family Medicine

## 2023-06-09 VITALS — BP 130/94 | HR 87 | Temp 98.2°F | Wt 224.0 lb

## 2023-06-09 DIAGNOSIS — E119 Type 2 diabetes mellitus without complications: Secondary | ICD-10-CM | POA: Diagnosis not present

## 2023-06-09 DIAGNOSIS — R319 Hematuria, unspecified: Secondary | ICD-10-CM | POA: Diagnosis not present

## 2023-06-09 LAB — POC URINALSYSI DIPSTICK (AUTOMATED)
Bilirubin, UA: NEGATIVE
Blood, UA: POSITIVE
Glucose, UA: NEGATIVE
Ketones, UA: NEGATIVE
Nitrite, UA: NEGATIVE
Protein, UA: POSITIVE — AB
Spec Grav, UA: 1.02 (ref 1.010–1.025)
Urobilinogen, UA: 0.2 E.U./dL
pH, UA: 6 (ref 5.0–8.0)

## 2023-06-09 LAB — POCT GLYCOSYLATED HEMOGLOBIN (HGB A1C): Hemoglobin A1C: 6.4 % — AB (ref 4.0–5.6)

## 2023-06-09 NOTE — Addendum Note (Signed)
Addended by: Carola Rhine on: 06/09/2023 04:51 PM   Modules accepted: Orders

## 2023-06-09 NOTE — Progress Notes (Signed)
   Subjective:    Patient ID: Darren Calhoun, male    DOB: July 28, 1971, 52 y.o.   MRN: [id number]  HPI Here with another episode of blood in the urine. He saw Dr. Berneice Heinrich in 2021, and he had lithotripsy for a kidney stone. At his last visit on 06-17-20, Xrays showed no remaining stones. He did well until he had one episode of discomfort at the tip of his penis when he urinated, and he saw a small amount of blood in the urine. Since then he has felt fine, and he has seen no further blood. No urgency or frequency. No abdominal or back pains. No fever.    Review of Systems  Constitutional: Negative.   Respiratory: Negative.    Cardiovascular: Negative.   Gastrointestinal: Negative.   Genitourinary:  Positive for dysuria and hematuria. Negative for difficulty urinating, flank pain, frequency and urgency.       Objective:   Physical Exam Constitutional:      Appearance: Normal appearance. He is not ill-appearing.  Cardiovascular:     Rate and Rhythm: Normal rate and regular rhythm.     Pulses: Normal pulses.     Heart sounds: Normal heart sounds.  Pulmonary:     Effort: Pulmonary effort is normal.     Breath sounds: Normal breath sounds.  Abdominal:     Tenderness: There is no right CVA tenderness or left CVA tenderness.  Neurological:     Mental Status: He is alert.           Assessment & Plan:  Recurrent hematuria. We will have him see Dr. Berneice Heinrich again to evaluate. We will send the sample for a culture.  Gershon Crane, MD

## 2023-06-09 NOTE — Addendum Note (Signed)
Addended by: Gershon Crane A on: 06/09/2023 01:34 PM   Modules accepted: Orders

## 2023-06-09 NOTE — Addendum Note (Signed)
Addended by: Carola Rhine on: 06/09/2023 05:05 PM   Modules accepted: Orders

## 2023-06-10 LAB — URINE CULTURE
MICRO NUMBER:: 15153439
Result:: NO GROWTH
SPECIMEN QUALITY:: ADEQUATE

## 2023-06-18 ENCOUNTER — Encounter: Payer: Self-pay | Admitting: Family Medicine

## 2023-08-02 ENCOUNTER — Other Ambulatory Visit: Payer: Self-pay | Admitting: Family Medicine

## 2023-08-03 DIAGNOSIS — R310 Gross hematuria: Secondary | ICD-10-CM | POA: Diagnosis not present

## 2023-08-03 DIAGNOSIS — Z125 Encounter for screening for malignant neoplasm of prostate: Secondary | ICD-10-CM | POA: Diagnosis not present

## 2023-08-03 DIAGNOSIS — N200 Calculus of kidney: Secondary | ICD-10-CM | POA: Diagnosis not present

## 2023-08-21 DIAGNOSIS — N200 Calculus of kidney: Secondary | ICD-10-CM | POA: Diagnosis not present

## 2023-08-21 DIAGNOSIS — R310 Gross hematuria: Secondary | ICD-10-CM | POA: Diagnosis not present

## 2023-09-15 DIAGNOSIS — N200 Calculus of kidney: Secondary | ICD-10-CM | POA: Diagnosis not present

## 2023-09-15 DIAGNOSIS — R310 Gross hematuria: Secondary | ICD-10-CM | POA: Diagnosis not present

## 2023-10-02 ENCOUNTER — Ambulatory Visit (INDEPENDENT_AMBULATORY_CARE_PROVIDER_SITE_OTHER): Payer: Medicaid Other | Admitting: Urology

## 2023-10-02 ENCOUNTER — Other Ambulatory Visit
Admission: RE | Admit: 2023-10-02 | Discharge: 2023-10-02 | Disposition: A | Discharge status: Home / Self Care | Payer: Medicaid Other | Source: Home / Self Care | Attending: Urology | Admitting: Urology

## 2023-10-02 ENCOUNTER — Ambulatory Visit
Admission: RE | Admit: 2023-10-02 | Discharge: 2023-10-02 | Disposition: A | Discharge status: Home / Self Care | Payer: Medicaid Other | Source: Ambulatory Visit | Attending: Urology | Admitting: Urology

## 2023-10-02 ENCOUNTER — Other Ambulatory Visit: Payer: Self-pay

## 2023-10-02 ENCOUNTER — Ambulatory Visit
Admission: RE | Admit: 2023-10-02 | Discharge: 2023-10-02 | Disposition: A | Discharge status: Home / Self Care | Payer: Medicaid Other | Attending: Urology | Admitting: Urology

## 2023-10-02 VITALS — BP 143/93 | HR 92 | Ht 69.75 in | Wt 224.1 lb

## 2023-10-02 DIAGNOSIS — N200 Calculus of kidney: Secondary | ICD-10-CM

## 2023-10-02 DIAGNOSIS — N1330 Unspecified hydronephrosis: Secondary | ICD-10-CM

## 2023-10-02 DIAGNOSIS — Z01818 Encounter for other preprocedural examination: Secondary | ICD-10-CM | POA: Diagnosis present

## 2023-10-02 LAB — URINALYSIS, COMPLETE (UACMP) WITH MICROSCOPIC
Bilirubin Urine: NEGATIVE
Glucose, UA: NEGATIVE mg/dL
Ketones, ur: NEGATIVE mg/dL
Nitrite: NEGATIVE
Protein, ur: NEGATIVE mg/dL
Specific Gravity, Urine: 1.015 (ref 1.005–1.030)
Squamous Epithelial / HPF: NONE SEEN /[HPF] (ref 0–5)
pH: 7 (ref 5.0–8.0)

## 2023-10-02 NOTE — Progress Notes (Signed)
[name],acting as a scribe for Darren Scotland, MD.,have documented all relevant documentation on the behalf of Darren Scotland, MD,as directed by  Darren Scotland, MD while in the presence of Darren Scotland, MD.  10/02/2023 4:33 PM   Darren Calhoun 08-20-1971 [id number]  Referring provider: Nelwyn Salisbury, MD [address] [place],  [place] [address]  Chief Complaint  Patient presents with   Nephrolithiasis    HPI: 52 year-old male with a personal history of gross hematuria and recurrent nephrolithiasis. He presents today to establish care.  He is currently a patient of [name]. He is seeking a second opinion.   He has large volume right renal stones with some focal upper pole hydronephrosis, which is felt to be likely the source of his gross hematuria. CT report from 09/2003 shows 2 mm right UPJ stone plus another 1 cm renal stone. Unfortunately, this was completed at [facility] and we do not have records for this. Dr. Loreta Ave had recommended staged ureteroscopy as opposed to PCNL, although that was an option that was mentioned.   He is considering treatment options for the stones, which include staged ureteroscopy, shockwave lithotripsy, and percutaneous nephrolithotomy. He expresses concern about the invasiveness and potential complications of the procedures. He is particularly interested in the ureteroscopy option.    PMH: Past Medical History:  Diagnosis Date   Allergy    Anxiety    Hypertension     Surgical History: Past Surgical History:  Procedure Laterality Date   EXTRACORPOREAL SHOCK WAVE LITHOTRIPSY Right 03/15/2020   Procedure: RIGHT EXTRACORPOREAL SHOCK WAVE LITHOTRIPSY (ESWL);  Surgeon: Noel Christmas, MD;  Location: [facility];  Service: Urology;  Laterality: Right;   KNEE ARTHROSCOPY     right   wisdom teeth extracted      Home Medications:  Allergies as of 10/02/2023       Reactions   Ciprofloxacin    Nightmares          Medication List        Accurate as of October 02, 2023  4:33 PM. If you have any questions, ask your nurse or doctor.          amLODipine-valsartan 5-160 MG tablet Commonly known as: EXFORGE TAKE 1 TABLET BY MOUTH DAILY   cetirizine-pseudoephedrine 5-120 MG tablet Commonly known as: ZYRTEC-D Take 1 tablet by mouth 2 (two) times daily.   fluticasone 50 MCG/ACT nasal spray Commonly known as: FLONASE INHALE 2 SPRAYS INTO THE NOSE DAILY.   metFORMIN 500 MG tablet Commonly known as: GLUCOPHAGE Take 1 tablet (500 mg total) by mouth daily before supper. *appointment required for future refills*   sertraline 100 MG tablet Commonly known as: ZOLOFT Take 1 tablet (100 mg total) by mouth daily.        Allergies:  Allergies  Allergen Reactions   Ciprofloxacin     Nightmares      Family History: Family History  Problem Relation Age of Onset   Diabetes Other    Hyperlipidemia Other    Hypertension Other    Alcohol abuse Other    Depression Other    Colon cancer Maternal Grandfather     Social History:  reports that he has been smoking cigars. He has never used smokeless tobacco. He reports that he does not drink alcohol and does not use drugs.   Physical Exam: BP (!) 143/93   Pulse 92   Ht 5' 9.75" (1.772 m)   Wt 224  lb 2 oz (101.7 kg)   BMI 32.39 kg/m   Constitutional:  Alert and oriented, No acute distress. HEENT: [place] AT, moist mucus membranes.  Trachea midline, no masses. Neurologic: Grossly intact, no focal deficits, moving all 4 extremities. Psychiatric: Normal mood and affect.   Assessment & Plan:    1. Right renal stone with hydronephrosis - Possible infection stone versus metabolic stone - Proceed with staged ureteroscopy as he prefers a less invasive approach. The procedure will involve laser lithotripsy with stent placement to prevent ureteral obstruction from stone fragments. A follow-up procedure will be scheduled to clear residual  stone burden. - Pre-operative urine culture to rule out infection. - Obtain a baseline KUB x-ray for intraoperative reference. - Post-procedure, send stone fragments for analysis to determine stone composition. - Post-stone clearance, evaluate for metabolic causes: check serum electrolytes, urinary tests, parathyroid function, and calcium levels. - Discussed the risks and benefits of ureteroscopy versus percutaneous nephrolithotomy (PCNL). - Informed about potential discomfort from stent placement and provided a medication plan to manage symptoms. - Provided written information about the ureteroscopy procedure. - Advised on the importance of addressing the stones to prevent kidney damage.  Return for ureteroscopy. Follow up in 2-3 weeks post-procedure for evaluation and potential 2nd stage ureteroscopy   [facility] [address] [place], [place] [address] [phone]

## 2023-10-02 NOTE — Patient Instructions (Signed)
Ureteroscopy  Ureteroscopy is a procedure to check for and treat problems inside part of the urinary tract. In this procedure, a long rigid or flexible tube with a lens and light at the end (ureteroscope) is used to look at the inside of the kidneys and the ureters. The ureters are the tubes that carry urine from the kidneys to the bladder. The ureteroscope is inserted into one or both of the ureters. You may need this procedure if you have frequent urinary tract infections (UTIs), blood in your urine, or a stone in one or both of your ureters. A ureteroscopy can be done: To find the cause of urine blockage in a ureter and to evaluate other abnormalities inside the ureters or kidneys. To remove stones. To remove or treat growths of tissue (polyps), abnormal tissue, and some types of tumors. To remove a tissue sample and check it for disease under a microscope (biopsy). Tell a health care provider about: Any allergies you have. All medicines you are taking, including vitamins, herbs, eye drops, creams, and over-the-counter medicines. Any problems you or family members have had with anesthetic medicines. Any bleeding problems you have. Any surgeries you have had. Any medical conditions you have. Whether you are pregnant or may be pregnant. What are the risks? Your health care provider will talk with you about risks. These may include: Abdominal pain or a burning feeling or pain while urinating. Abnormal bleeding. A UTI. Allergic reactions to medicines. Scarring that narrows the ureter (stricture) or swelling. Creating a hole (perforation) in the ureter. Damage to other structures or organs, such as the part of your body that drains urine from your bladder (urethra), your bladder, or your uterus. What happens before the procedure? When to stop eating and drinking  8 hours before your procedure Stop eating most foods. Do not eat meat, fried foods, or fatty foods. Eat only light foods, such  as toast or crackers. All liquids are okay except energy drinks and alcohol. 6 hours before your procedure Stop eating. Drink only clear liquids, such as water, clear fruit juice, black coffee, plain tea, and sports drinks. Do not drink energy drinks or alcohol. 2 hours before your procedure Stop drinking all liquids. You may be allowed to take medicines with small sips of water. Medicines Ask your health care provider about: Changing or stopping your regular medicines. These include any diabetes medicines or blood thinners you take. Taking medicines such as aspirin and ibuprofen. These medicines can thin your blood. Do not take these medicines unless your health care provider tells you to. Taking over-the-counter medicines, vitamins, herbs, and supplements. General instructions Do not use any products that contain nicotine or tobacco for at least 4 weeks before the procedure. These products include cigarettes, chewing tobacco, and vaping devices, such as e-cigarettes. If you need help quitting, ask your health care provider. If you will be going home right after the procedure, plan to have a responsible adult: Take you home from the hospital or clinic. You will not be allowed to drive. Care for you for the time you are told. Ask your health care provider what steps will be taken to help prevent infection. These may include: Washing skin with a soap that kills germs. Receiving antibiotic medicine. Tests You may have an exam or testing. You may have a urine sample taken to check for infection. What happens during the procedure? An IV will be inserted into one of your veins. You may be given: A sedative. This helps   you relax. Anesthesia. This will: Numb certain areas of your body. Make you fall asleep for surgery. Your urethra will be cleaned with a germ-killing solution. The ureteroscope will be passed through your urethra into your bladder. A salt-water solution will be sent through  the ureteroscope to fill your bladder. This will help the health care provider see the openings of your ureters more clearly. The ureteroscope will be passed into your ureter. If a growth is found, a biopsy may be done. If a stone is found, it may be removed through the ureteroscope, or the stone may be broken up using a laser, shock waves, or electrical energy. In some cases, if the ureter is too small, a tube may be inserted that keeps the ureter open (ureteral stent). The stent may be left in place for 1 or 2 weeks, and then the ureteroscopy procedure will be done again. The scope will be removed, and your bladder will be emptied. The procedure may vary among health care providers and hospitals. What happens after the procedure? Your blood pressure, heart rate, breathing rate, and blood oxygen level will be monitored until you leave the hospital or clinic. It is up to you to get the results of your procedure. Ask your health care provider, or the department that is doing the procedure, when your results will be ready. Summary Ureteroscopy is a procedure used to look at the inside of the kidneys and the ureters. You may need this procedure if you have frequent urinary tract infections (UTIs), blood in your urine, or a stone in one or both of your ureters. Follow instructions from your health care provider about eating and drinking. In some cases, if the ureter is too small, a tube may be inserted that keeps the ureter open (ureteral stent). The stent may be left in place for 1 or 2 weeks to keep the ureter open, and then the ureteroscopy procedure will be done again. This information is not intended to replace advice given to you by your health care provider. Make sure you discuss any questions you have with your health care provider. Document Revised: 10/27/2022 Document Reviewed: 10/27/2022 Elsevier Patient Education  2024 Elsevier Inc.  

## 2023-10-02 NOTE — H&P (View-Only) (Signed)
Darren Calhoun,acting as a scribe for Darren Calhoun.,have documented all relevant documentation on the behalf of Darren Calhoun,as directed by  Darren Calhoun while in the presence of Darren Calhoun.  10/02/2023 4:33 PM   Darren Calhoun 08-20-1971 [id number]  Referring provider: Nelwyn Salisbury, Calhoun [address] [place],  [place] [address]  Chief Complaint  Patient presents with   Nephrolithiasis    HPI: 52 year-old male with a personal history of gross hematuria and recurrent nephrolithiasis. He presents today to establish care.  He is currently a patient of Darren Calhoun. He is seeking a second opinion.   He has large volume right renal stones with some focal upper pole hydronephrosis, which is felt to be likely the source of his gross hematuria. CT report from 09/2003 shows 2 mm right UPJ stone plus another 1 cm renal stone. Unfortunately, this was completed at [facility] and we do not have records for this. Darren Calhoun had recommended staged ureteroscopy as opposed to PCNL, although that was an option that was mentioned.   He is considering treatment options for the stones, which include staged ureteroscopy, shockwave lithotripsy, and percutaneous nephrolithotomy. He expresses concern about the invasiveness and potential complications of the procedures. He is particularly interested in the ureteroscopy option.    PMH: Past Medical History:  Diagnosis Date   Allergy    Anxiety    Hypertension     Surgical History: Past Surgical History:  Procedure Laterality Date   EXTRACORPOREAL SHOCK WAVE LITHOTRIPSY Right 03/15/2020   Procedure: RIGHT EXTRACORPOREAL SHOCK WAVE LITHOTRIPSY (ESWL);  Surgeon: Darren Christmas, Calhoun;  Location: [facility];  Service: Urology;  Laterality: Right;   KNEE ARTHROSCOPY     right   wisdom teeth extracted      Home Medications:  Allergies as of 10/02/2023       Reactions   Ciprofloxacin    Nightmares          Medication List        Accurate as of October 02, 2023  4:33 PM. If you have any questions, ask your nurse or doctor.          amLODipine-valsartan 5-160 MG tablet Commonly known as: EXFORGE TAKE 1 TABLET BY MOUTH DAILY   cetirizine-pseudoephedrine 5-120 MG tablet Commonly known as: ZYRTEC-D Take 1 tablet by mouth 2 (two) times daily.   fluticasone 50 MCG/ACT nasal spray Commonly known as: FLONASE INHALE 2 SPRAYS INTO THE NOSE DAILY.   metFORMIN 500 MG tablet Commonly known as: GLUCOPHAGE Take 1 tablet (500 mg total) by mouth daily before supper. *appointment required for future refills*   sertraline 100 MG tablet Commonly known as: ZOLOFT Take 1 tablet (100 mg total) by mouth daily.        Allergies:  Allergies  Allergen Reactions   Ciprofloxacin     Nightmares      Family History: Family History  Problem Relation Age of Onset   Diabetes Other    Hyperlipidemia Other    Hypertension Other    Alcohol abuse Other    Depression Other    Colon cancer Maternal Grandfather     Social History:  reports that he has been smoking cigars. He has never used smokeless tobacco. He reports that he does not drink alcohol and does not use drugs.   Physical Exam: BP (!) 143/93   Pulse 92   Ht 5' 9.75" (1.772 m)   Wt 224  lb 2 oz (101.7 kg)   BMI 32.39 kg/m   Constitutional:  Alert and oriented, No acute distress. HEENT: [place] AT, moist mucus membranes.  Trachea midline, no masses. Neurologic: Grossly intact, no focal deficits, moving all 4 extremities. Psychiatric: Normal mood and affect.   Assessment & Plan:    1. Right renal stone with hydronephrosis - Possible infection stone versus metabolic stone - Proceed with staged ureteroscopy as he prefers a less invasive approach. The procedure will involve laser lithotripsy with stent placement to prevent ureteral obstruction from stone fragments. A follow-up procedure will be scheduled to clear residual  stone burden. - Pre-operative urine culture to rule out infection. - Obtain a baseline KUB x-ray for intraoperative reference. - Post-procedure, send stone fragments for analysis to determine stone composition. - Post-stone clearance, evaluate for metabolic causes: check serum electrolytes, urinary tests, parathyroid function, and calcium levels. - Discussed the risks and benefits of ureteroscopy versus percutaneous nephrolithotomy (PCNL). - Informed about potential discomfort from stent placement and provided a medication plan to manage symptoms. - Provided written information about the ureteroscopy procedure. - Advised on the importance of addressing the stones to prevent kidney damage.  Return for ureteroscopy. Follow up in 2-3 weeks post-procedure for evaluation and potential 2nd stage ureteroscopy   [facility] [address] [place], [place] [address] [phone]

## 2023-10-04 LAB — URINE CULTURE: Culture: NO GROWTH

## 2023-10-05 ENCOUNTER — Other Ambulatory Visit: Payer: Self-pay

## 2023-10-05 ENCOUNTER — Telehealth: Payer: Self-pay

## 2023-10-05 DIAGNOSIS — N200 Calculus of kidney: Secondary | ICD-10-CM

## 2023-10-05 NOTE — Progress Notes (Signed)
   [facility] Surgical Posting Form  Surgery Date: Date: 10/19/2023  Surgeon: Dr. Vanna Scotland, MD  Inpt ( No  )   Outpt (Yes)   Obs ( No  )   Diagnosis: N20.0 Right Nephrolithiasis  -CPT: [id number]  Surgery: Right Ureteroscopy with Laser Lithotripsy and Stent Placement   Stop Anticoagulations: Yes and also hold ASA  Cardiac/Medical/Pulmonary Clearance needed: no  *Orders entered into EPIC  Date: 10/05/23   *Case booked in [place]  Date: 10/05/23  *Notified pt of Surgery: Date: 10/05/23  PRE-OP UA & CX: no  *Placed into Prior Authorization Work Que Date: 10/05/23  Assistant/laser/rep:No

## 2023-10-05 NOTE — Progress Notes (Signed)
Surgical Physician Order Form [facility]  Dr. Vanna Scotland, MD  * Scheduling expectation : Next Available  *Length of Case: 2 hours  *Clearance needed: no  *Anticoagulation Instructions: Hold all anticoagulants  *Aspirin Instructions: Hold Aspirin  *Post-op visit Date/Instructions:  Book second stage procedure 2-3 weeks after initial  *Diagnosis: Right Nephrolithiasis  *Procedure: right Ureteroscopy w/laser lithotripsy & stent placement ([id number])   Additional orders: N/A  -Admit type: OUTpatient  -Anesthesia: General  -VTE Prophylaxis Standing Order SCD's       Other:   -Standing Lab Orders Per Anesthesia    Lab other: None  -Standing Test orders EKG/Chest x-ray per Anesthesia       Test other:   - Medications:  Ancef 2gm IV  -Other orders:  N/A

## 2023-10-05 NOTE — Telephone Encounter (Signed)
Per Dr. Apolinar Junes, Patient is to be scheduled for Right Ureteroscopy with Laser Lithotripsy and Stent Placement   Mr. Phaneuf was contacted and possible surgical dates were discussed, Monday November 11th, 2024 was agreed upon for surgery.   Patient was directed to call [phone] between 1-3pm the day before surgery to find out surgical arrival time.  Instructions were given not to eat or drink from midnight on the night before surgery and have a driver for the day of surgery. On the surgery day patient was instructed to enter through the Medical Mall entrance of [facility] report the Same Day Surgery desk.   Pre-Admit Testing will be in contact via phone to set up an interview with the anesthesia team to review your history and medications prior to surgery.   Reminder of this information was sent via MyChart to the patient.

## 2023-10-13 ENCOUNTER — Encounter
Admission: RE | Admit: 2023-10-13 | Discharge: 2023-10-13 | Disposition: A | Discharge status: Home / Self Care | Payer: Medicaid Other | Source: Ambulatory Visit | Attending: Urology | Admitting: Urology

## 2023-10-13 ENCOUNTER — Other Ambulatory Visit: Payer: Self-pay

## 2023-10-13 ENCOUNTER — Ambulatory Visit: Payer: Medicaid Other | Admitting: General Surgery

## 2023-10-13 VITALS — Ht 69.0 in | Wt 225.0 lb

## 2023-10-13 DIAGNOSIS — I10 Essential (primary) hypertension: Secondary | ICD-10-CM

## 2023-10-13 DIAGNOSIS — E119 Type 2 diabetes mellitus without complications: Secondary | ICD-10-CM

## 2023-10-13 DIAGNOSIS — E785 Hyperlipidemia, unspecified: Secondary | ICD-10-CM

## 2023-10-13 HISTORY — DX: Type 2 diabetes mellitus without complications: E11.9

## 2023-10-13 HISTORY — DX: Other complications of anesthesia, initial encounter: T88.59XA

## 2023-10-13 HISTORY — DX: Other specified postprocedural states: Z98.890

## 2023-10-13 HISTORY — DX: Personal history of urinary calculi: Z87.442

## 2023-10-13 NOTE — Patient Instructions (Addendum)
Your procedure is scheduled on: Monday 10/19/23 To find out your arrival time, please call [phone] between 1PM - 3PM on:   Friday 10/16/23 Report to the Registration Desk on the 1st floor of the Medical Mall. Free Valet parking is available.  If your arrival time is 6:00 am, do not arrive before that time as the Medical Mall entrance doors do not open until 6:00 am.  REMEMBER: Instructions that are not followed completely may result in serious medical risk, up to and including death; or upon the discretion of your surgeon and anesthesiologist your surgery may need to be rescheduled.  Do not eat food or drink any liquids after midnight the night before surgery.  No gum chewing or hard candies.  One week prior to surgery: Stop Anti-inflammatories (NSAIDS) such as Advil, Aleve, Ibuprofen, Motrin, Naproxen, Naprosyn and Aspirin based products such as Excedrin, Goody's Powder, BC Powder. You may however, continue to take Tylenol if needed for pain up until the day of surgery.  Stop ANY OVER THE COUNTER supplements and vitamins TODAY 10/13/23 until after surgery.  Continue taking all prescribed medications with the exception of the following: METFORMIN, last dose Friday 10/16/23  TAKE ONLY THESE MEDICATIONS THE MORNING OF SURGERY WITH A SIP OF WATER:  sertraline (ZOLOFT) 100 MG tablet  You can use your Flonase if needed   No Alcohol for 24 hours before or after surgery.  No Smoking including e-cigarettes for 24 hours before surgery.  No chewable tobacco products for at least 6 hours before surgery.  No nicotine patches on the day of surgery.  Do not use any "recreational" drugs for at least a week (preferably 2 weeks) before your surgery.  Please be advised that the combination of cocaine and anesthesia may have negative outcomes, up to and including death. If you test positive for cocaine, your surgery will be cancelled.  On the morning of surgery brush your teeth with toothpaste  and water, you may rinse your mouth with mouthwash if you wish. Do not swallow any toothpaste or mouthwash.  Use CHG Soap or wipes as directed on instruction sheet. Shower with your regular soap the day of your procedure.  Do not wear lotions, powders, or perfumes.   Do not shave body hair from the neck down 48 hours before surgery.  Wear comfortable clothing (specific to your surgery type) to the hospital.  Do not wear jewelry, make-up, hairpins, clips or nail polish.  For welded (permanent) jewelry: bracelets, anklets, waist bands, etc.  Please have this removed prior to surgery.  If it is not removed, there is a chance that hospital personnel will need to cut it off on the day of surgery. Contact lenses, hearing aids and dentures may not be worn into surgery.  Do not bring valuables to the hospital. [facility] is not responsible for any missing/lost belongings or valuables.   Notify your doctor if there is any change in your medical condition (cold, fever, infection).  If you are being discharged the day of surgery, you will not be allowed to drive home. You will need a responsible individual to drive you home and stay with you for 24 hours after surgery.   If you are taking public transportation, you will need to have a responsible individual with you.  If you are being admitted to the hospital overnight, leave your suitcase in the car. After surgery it may be brought to your room.  In case of increased patient census, it may be  necessary for you, the patient, to continue your postoperative care in the Same Day Surgery department.  After surgery, you can help prevent lung complications by doing breathing exercises.  Take deep breaths and cough every 1-2 hours. Your doctor may order a device called an Incentive Spirometer to help you take deep breaths. When coughing or sneezing, hold a pillow firmly against your incision with both hands. This is called "splinting." Doing this helps  protect your incision. It also decreases belly discomfort.  Surgery Visitation Policy:  Patients undergoing a surgery or procedure may have two family members or support persons with them as long as the person is not COVID-19 positive or experiencing its symptoms.   Inpatient Visitation:    Visiting hours are 7 a.m. to 8 p.m. Up to four visitors are allowed at one time in a patient room. The visitors may rotate out with other people during the day. One designated support person (adult) may remain overnight.  Please call the Pre-admissions Testing Dept. at [phone] if you have any questions about these instructions.

## 2023-10-14 ENCOUNTER — Encounter
Admission: RE | Admit: 2023-10-14 | Discharge: 2023-10-14 | Disposition: A | Discharge status: Home / Self Care | Payer: Medicaid Other | Source: Ambulatory Visit | Attending: Urology | Admitting: Urology

## 2023-10-14 ENCOUNTER — Encounter: Payer: Self-pay | Admitting: Urgent Care

## 2023-10-14 DIAGNOSIS — Z01818 Encounter for other preprocedural examination: Secondary | ICD-10-CM | POA: Diagnosis present

## 2023-10-14 DIAGNOSIS — Z01810 Encounter for preprocedural cardiovascular examination: Secondary | ICD-10-CM | POA: Diagnosis not present

## 2023-10-14 DIAGNOSIS — E119 Type 2 diabetes mellitus without complications: Secondary | ICD-10-CM | POA: Insufficient documentation

## 2023-10-14 DIAGNOSIS — R9431 Abnormal electrocardiogram [ECG] [EKG]: Secondary | ICD-10-CM | POA: Insufficient documentation

## 2023-10-14 DIAGNOSIS — E785 Hyperlipidemia, unspecified: Secondary | ICD-10-CM | POA: Diagnosis not present

## 2023-10-14 DIAGNOSIS — I10 Essential (primary) hypertension: Secondary | ICD-10-CM | POA: Diagnosis not present

## 2023-10-14 LAB — CBC
HCT: 47.1 % (ref 39.0–52.0)
Hemoglobin: 16.5 g/dL (ref 13.0–17.0)
MCH: 30.2 pg (ref 26.0–34.0)
MCHC: 35 g/dL (ref 30.0–36.0)
MCV: 86.1 fL (ref 80.0–100.0)
Platelets: 260 10*3/uL (ref 150–400)
RBC: 5.47 MIL/uL (ref 4.22–5.81)
RDW: 12.2 % (ref 11.5–15.5)
WBC: 7.1 10*3/uL (ref 4.0–10.5)
nRBC: 0 % (ref 0.0–0.2)

## 2023-10-14 LAB — BASIC METABOLIC PANEL
Anion gap: 9 (ref 5–15)
BUN: 23 mg/dL — ABNORMAL HIGH (ref 6–20)
CO2: 24 mmol/L (ref 22–32)
Calcium: 9.2 mg/dL (ref 8.9–10.3)
Chloride: 102 mmol/L (ref 98–111)
Creatinine, Ser: 0.99 mg/dL (ref 0.61–1.24)
GFR, Estimated: >60 mL/min (ref >60)
Glucose, Bld: 152 mg/dL — ABNORMAL HIGH (ref 70–99)
Potassium: 3.8 mmol/L (ref 3.5–5.1)
Sodium: 135 mmol/L (ref 135–145)

## 2023-10-16 ENCOUNTER — Encounter: Payer: Self-pay | Admitting: Urology

## 2023-10-18 MED ORDER — CHLORHEXIDINE GLUCONATE 0.12 % MT SOLN: 15.0000 mL | Freq: Once | OROMUCOSAL | Status: DC

## 2023-10-18 MED ORDER — CEFAZOLIN SODIUM-DEXTROSE 2-4 GM/100ML-% IV SOLN
2.0000 g | INTRAVENOUS | Status: AC
  Administered 2023-10-19: 2 g via INTRAVENOUS

## 2023-10-18 MED ORDER — ORAL CARE MOUTH RINSE: 15.0000 mL | Freq: Once | OROMUCOSAL | Status: DC

## 2023-10-18 MED ORDER — SODIUM CHLORIDE 0.9 % IV SOLN
INTRAVENOUS | Status: DC
Start: 2023-10-18 — End: 2023-10-19

## 2023-10-19 ENCOUNTER — Ambulatory Visit
Admission: RE | Admit: 2023-10-19 | Discharge: 2023-10-19 | Disposition: A | Discharge status: Home / Self Care | Payer: Medicaid Other | Attending: Urology | Admitting: Urology

## 2023-10-19 ENCOUNTER — Other Ambulatory Visit: Payer: Self-pay

## 2023-10-19 ENCOUNTER — Ambulatory Visit: Payer: Medicaid Other | Admitting: Urgent Care

## 2023-10-19 ENCOUNTER — Encounter
Admission: RE | Disposition: A | Discharge status: Home / Self Care | Payer: Self-pay | Source: Home / Self Care | Attending: Urology

## 2023-10-19 ENCOUNTER — Encounter: Payer: Self-pay | Admitting: Urology

## 2023-10-19 ENCOUNTER — Ambulatory Visit: Payer: Medicaid Other

## 2023-10-19 DIAGNOSIS — Z7984 Long term (current) use of oral hypoglycemic drugs: Secondary | ICD-10-CM | POA: Diagnosis not present

## 2023-10-19 DIAGNOSIS — N132 Hydronephrosis with renal and ureteral calculous obstruction: Secondary | ICD-10-CM | POA: Diagnosis present

## 2023-10-19 DIAGNOSIS — F419 Anxiety disorder, unspecified: Secondary | ICD-10-CM | POA: Insufficient documentation

## 2023-10-19 DIAGNOSIS — R310 Gross hematuria: Secondary | ICD-10-CM | POA: Insufficient documentation

## 2023-10-19 DIAGNOSIS — I10 Essential (primary) hypertension: Secondary | ICD-10-CM | POA: Insufficient documentation

## 2023-10-19 DIAGNOSIS — E119 Type 2 diabetes mellitus without complications: Secondary | ICD-10-CM | POA: Insufficient documentation

## 2023-10-19 DIAGNOSIS — F17210 Nicotine dependence, cigarettes, uncomplicated: Secondary | ICD-10-CM | POA: Diagnosis not present

## 2023-10-19 DIAGNOSIS — Z87442 Personal history of urinary calculi: Secondary | ICD-10-CM | POA: Diagnosis not present

## 2023-10-19 DIAGNOSIS — N200 Calculus of kidney: Secondary | ICD-10-CM

## 2023-10-19 HISTORY — PX: CYSTOSCOPY/URETEROSCOPY/HOLMIUM LASER/STENT PLACEMENT: SHX6546

## 2023-10-19 LAB — GLUCOSE, CAPILLARY
Glucose-Capillary: 146 mg/dL — ABNORMAL HIGH (ref 70–99)
Glucose-Capillary: 148 mg/dL — ABNORMAL HIGH (ref 70–99)

## 2023-10-19 SURGERY — CYSTOSCOPY/URETEROSCOPY/HOLMIUM LASER/STENT PLACEMENT
Anesthesia: General | Site: Ureter | Laterality: Right

## 2023-10-19 MED ORDER — OXYCODONE HCL 5 MG/5ML PO SOLN: 5.0000 mg | Freq: Once | ORAL | Status: DC | PRN

## 2023-10-19 MED ORDER — SUCCINYLCHOLINE CHLORIDE 200 MG/10ML IV SOSY
PREFILLED_SYRINGE | INTRAVENOUS | Status: AC
  Filled 2023-10-19: qty 10

## 2023-10-19 MED ORDER — PROPOFOL 10 MG/ML IV BOLUS
INTRAVENOUS | Status: AC
  Filled 2023-10-19: qty 20

## 2023-10-19 MED ORDER — DEXAMETHASONE SODIUM PHOSPHATE 10 MG/ML IJ SOLN
INTRAMUSCULAR | Status: AC
  Filled 2023-10-19: qty 1

## 2023-10-19 MED ORDER — CHLORHEXIDINE GLUCONATE 0.12 % MT SOLN
OROMUCOSAL | Status: AC
  Filled 2023-10-19: qty 15

## 2023-10-19 MED ORDER — SODIUM CHLORIDE 0.9 % IR SOLN
Status: DC | PRN
  Administered 2023-10-19: 3000 mL

## 2023-10-19 MED ORDER — CEFAZOLIN SODIUM-DEXTROSE 2-4 GM/100ML-% IV SOLN
INTRAVENOUS | Status: AC
  Filled 2023-10-19: qty 100

## 2023-10-19 MED ORDER — KETOROLAC TROMETHAMINE 30 MG/ML IJ SOLN
INTRAMUSCULAR | Status: DC | PRN
  Administered 2023-10-19: 30 mg via INTRAVENOUS

## 2023-10-19 MED ORDER — FENTANYL CITRATE (PF) 100 MCG/2ML IJ SOLN
INTRAMUSCULAR | Status: AC
  Filled 2023-10-19: qty 2

## 2023-10-19 MED ORDER — SUGAMMADEX SODIUM 200 MG/2ML IV SOLN
INTRAVENOUS | Status: DC | PRN
  Administered 2023-10-19: 200 mg via INTRAVENOUS

## 2023-10-19 MED ORDER — IOHEXOL 180 MG/ML  SOLN
INTRAMUSCULAR | Status: DC | PRN
  Administered 2023-10-19 (×2): 10 mL

## 2023-10-19 MED ORDER — DEXAMETHASONE SODIUM PHOSPHATE 10 MG/ML IJ SOLN
INTRAMUSCULAR | Status: DC | PRN
  Administered 2023-10-19: 10 mg via INTRAVENOUS

## 2023-10-19 MED ORDER — ACETAMINOPHEN 10 MG/ML IV SOLN: 1000.0000 mg | Freq: Once | INTRAVENOUS | Status: DC | PRN

## 2023-10-19 MED ORDER — NALOXONE HCL 0.4 MG/ML IJ SOLN
INTRAMUSCULAR | Status: AC
  Filled 2023-10-19: qty 1

## 2023-10-19 MED ORDER — ROCURONIUM BROMIDE 100 MG/10ML IV SOLN
INTRAVENOUS | Status: DC | PRN
  Administered 2023-10-19: 20 mg via INTRAVENOUS
  Administered 2023-10-19: 50 mg via INTRAVENOUS

## 2023-10-19 MED ORDER — FENTANYL CITRATE (PF) 100 MCG/2ML IJ SOLN: 25.0000 ug | INTRAMUSCULAR | Status: DC | PRN

## 2023-10-19 MED ORDER — LIDOCAINE HCL (CARDIAC) PF 100 MG/5ML IV SOSY
PREFILLED_SYRINGE | INTRAVENOUS | Status: DC | PRN
  Administered 2023-10-19: 100 mg via INTRAVENOUS

## 2023-10-19 MED ORDER — OXYBUTYNIN CHLORIDE 5 MG PO TABS: 5.0000 mg | ORAL_TABLET | Freq: Three times a day (TID) | ORAL | 0 refills | Status: DC | PRN

## 2023-10-19 MED ORDER — KETOROLAC TROMETHAMINE 30 MG/ML IJ SOLN
INTRAMUSCULAR | Status: AC
  Filled 2023-10-19: qty 1

## 2023-10-19 MED ORDER — ONDANSETRON HCL 4 MG/2ML IJ SOLN
INTRAMUSCULAR | Status: AC
  Filled 2023-10-19: qty 2

## 2023-10-19 MED ORDER — FENTANYL CITRATE (PF) 100 MCG/2ML IJ SOLN
INTRAMUSCULAR | Status: DC | PRN
  Administered 2023-10-19 (×2): 50 ug via INTRAVENOUS

## 2023-10-19 MED ORDER — LIDOCAINE HCL (PF) 2 % IJ SOLN
INTRAMUSCULAR | Status: AC
Start: 2023-10-19 — End: ?
  Filled 2023-10-19: qty 10

## 2023-10-19 MED ORDER — PROPOFOL 10 MG/ML IV BOLUS
INTRAVENOUS | Status: DC | PRN
  Administered 2023-10-19: 100 ug/kg/min via INTRAVENOUS
  Administered 2023-10-19: 200 mg via INTRAVENOUS

## 2023-10-19 MED ORDER — OXYCODONE HCL 5 MG PO TABS: 5.0000 mg | ORAL_TABLET | Freq: Once | ORAL | Status: DC | PRN

## 2023-10-19 MED ORDER — ROCURONIUM BROMIDE 10 MG/ML (PF) SYRINGE
PREFILLED_SYRINGE | INTRAVENOUS | Status: AC
Start: 2023-10-19 — End: ?
  Filled 2023-10-19: qty 10

## 2023-10-19 MED ORDER — TAMSULOSIN HCL 0.4 MG PO CAPS: 0.4000 mg | ORAL_CAPSULE | Freq: Every day | ORAL | 0 refills | Status: DC

## 2023-10-19 MED ORDER — HYDROCODONE-ACETAMINOPHEN 5-325 MG PO TABS: 1.0000 | ORAL_TABLET | Freq: Four times a day (QID) | ORAL | 0 refills | Status: DC | PRN

## 2023-10-19 MED ORDER — MIDAZOLAM HCL 2 MG/2ML IJ SOLN
INTRAMUSCULAR | Status: DC | PRN
  Administered 2023-10-19: 2 mg via INTRAVENOUS

## 2023-10-19 MED ORDER — ONDANSETRON HCL 4 MG/2ML IJ SOLN
INTRAMUSCULAR | Status: DC | PRN
  Administered 2023-10-19: 4 mg via INTRAVENOUS

## 2023-10-19 MED ORDER — NALOXONE HCL 0.4 MG/ML IJ SOLN
INTRAMUSCULAR | Status: DC | PRN
  Administered 2023-10-19: .1 mg via INTRAVENOUS

## 2023-10-19 MED ORDER — DROPERIDOL 2.5 MG/ML IJ SOLN: 0.6250 mg | Freq: Once | INTRAMUSCULAR | Status: DC | PRN

## 2023-10-19 MED ORDER — MIDAZOLAM HCL 2 MG/2ML IJ SOLN
INTRAMUSCULAR | Status: AC
Start: 2023-10-19 — End: ?
  Filled 2023-10-19: qty 2

## 2023-10-19 MED ORDER — SUCCINYLCHOLINE CHLORIDE 200 MG/10ML IV SOSY
PREFILLED_SYRINGE | INTRAVENOUS | Status: DC | PRN
  Administered 2023-10-19: 100 mg via INTRAVENOUS

## 2023-10-19 SURGICAL SUPPLY — 31 items
ADH LQ OCL WTPRF AMP STRL LF (MISCELLANEOUS)
ADHESIVE MASTISOL STRL (MISCELLANEOUS) IMPLANT
BAG DRAIN SIEMENS DORNER NS (MISCELLANEOUS) ×1 IMPLANT
BAG DRN NS LF (MISCELLANEOUS) ×1
BASKET ZERO TIP 1.9FR (BASKET) IMPLANT
BRUSH SCRUB EZ 1% IODOPHOR (MISCELLANEOUS) ×1 IMPLANT
BRUSH SCRUB EZ 4% CHG (MISCELLANEOUS) IMPLANT
BSKT STON RTRVL ZERO TP 1.9FR (BASKET)
CATH URET FLEX-TIP 2 LUMEN 10F (CATHETERS) IMPLANT
CATH URETL OPEN 5X70 (CATHETERS) ×1 IMPLANT
CNTNR URN SCR LID CUP LEK RST (MISCELLANEOUS) IMPLANT
CONT SPEC 4OZ STRL OR WHT (MISCELLANEOUS)
DRAPE UTILITY 15X26 TOWEL STRL (DRAPES) ×1 IMPLANT
DRSG TEGADERM 2-3/8X2-3/4 SM (GAUZE/BANDAGES/DRESSINGS) IMPLANT
FIBER LASER MOSES 200 DFL (Laser) IMPLANT
FIBER LASER MOSES 365 DFL (Laser) IMPLANT
GLOVE BIO SURGEON STRL SZ 6.5 (GLOVE) ×1 IMPLANT
GOWN STRL REUS W/ TWL LRG LVL3 (GOWN DISPOSABLE) ×2 IMPLANT
GOWN STRL REUS W/TWL LRG LVL3 (GOWN DISPOSABLE) ×2
GUIDEWIRE GREEN .038 145CM (MISCELLANEOUS) IMPLANT
GUIDEWIRE STR DUAL SENSOR (WIRE) ×1 IMPLANT
INTRODUCER DILATOR DOUBLE (INTRODUCER) IMPLANT
IV NS IRRIG 3000ML ARTHROMATIC (IV SOLUTION) ×1 IMPLANT
KIT TURNOVER CYSTO (KITS) ×1 IMPLANT
PACK CYSTO AR (MISCELLANEOUS) ×1 IMPLANT
SET CYSTO W/LG BORE CLAMP LF (SET/KITS/TRAYS/PACK) ×1 IMPLANT
SHEATH NAVIGATOR HD 12/14X36 (SHEATH) IMPLANT
STENT URET 6FRX24 CONTOUR (STENTS) IMPLANT
STENT URET 6FRX26 CONTOUR (STENTS) IMPLANT
SURGILUBE 2OZ TUBE FLIPTOP (MISCELLANEOUS) ×1 IMPLANT
WATER STERILE IRR 500ML POUR (IV SOLUTION) ×1 IMPLANT

## 2023-10-19 NOTE — Interval H&P Note (Signed)
History and Physical Interval Note:  10/19/2023 12:44 PM  Darren Calhoun  has presented today for surgery, with the diagnosis of Right Nephrolithiasis.  The various methods of treatment have been discussed with the patient and family. After consideration of risks, benefits and other options for treatment, the patient has consented to  Procedure(s): CYSTOSCOPY/URETEROSCOPY/HOLMIUM LASER/STENT PLACEMENT (Right) as a surgical intervention.  The patient's history has been reviewed, patient examined, no change in status, stable for surgery.  I have reviewed the patient's chart and labs.  Questions were answered to the patient's satisfaction.    RRR CTAB  Vanna Scotland

## 2023-10-19 NOTE — Op Note (Signed)
Date of procedure: 10/19/23  Preoperative diagnosis:  Right kidney stone  Postoperative diagnosis:  Same as above  Procedure: Right ureteroscopy Laser lithotripsy Right ureteral stent  Retrograde pyelogram Interpretation of fluoroscopy less than 30 minutes  Surgeon: Vanna Scotland, MD  Anesthesia: General  Complications: None  Intraoperative findings: 2 nonobstructing stones in the right kidney, 1 to 1.8 cm and the other 8 mm.  Both of the stones were dusted.  Stent placed without tether.  EBL: Minimal  Specimens: None  Drains: 6 x 24 French double-J ureteral stent on right  Indication: Darren Calhoun is a 52 y.o. patient with 2 large nonobstructing right kidney stones identified secondary to hematuria.  After reviewing the management options for treatment,   elected to proceed with the above surgical procedure(s). We have discussed the potential benefits and risks of the procedure, side effects of the proposed treatment, the likelihood of the patient achieving the goals of the procedure, and any potential problems that might occur during the procedure or recuperation. Informed consent has been obtained.  Description of procedure:  The patient was taken to the operating room and general anesthesia was induced.  The patient was placed in the dorsal lithotomy position, prepped and draped in the usual sterile fashion, and preoperative antibiotics were administered. A preoperative time-out was performed.    A 21 French scope was advanced per urethra into the bladder.  Attention was turned to the right ureteral orifice.  Notably, the bladder was inspected noted to be free of any tumors masses or lesions.  On scout imaging, the 2 stones in question could be seen easily.  The ureter was then intubated using a 5 [place] open-ended ureteral catheter and gentle retrograde pyelogram showed no hydronephrosis.  A sensor wire was then placed up to the level of the kidney.  A dual-lumen  ureteral access sheath was then used to introduce a second Super Stiff wire was then introduced all the way up to the level of the kidney through the second lumen of the catheter.  The sensor wire was snapped in place as a safety wire.  A ureteral access sheath, 12/14 [place] was advanced to the proximal.  A dual-lumen digital ureteroscope was then advanced up to the level of the stone.  A 365 m laser fiber was then brought in using dusting settings of 0.3 J and 120 Hz, the stone was dusted. The scope was then advanced into the each of the calyces and additional stone burden was identified and dusted.  At the end of the procedure, no significant residual stone fragment remained greater than the size of the tip of the laser fiber.  A final retrograde pyelogram created roadmap to ensure that each every calyx was visualized and all significant stone burden was addressed.  There was no contrast extravasation.  The scope was then backed down the length of the ureter inspecting the ureteral integrity along the way.  There was no residual stone burden or significant ureteral injuries appreciated.  Finally, a 6 by 24 French ureteral access sheath was advanced over the safety wire up to the level of the kidney.  Upon wire removal, there was a full coil noted both within the renal pelvis as well as within the bladder.  The patient was then cleaned and dried, repositioned in supine position, reversed of anesthesia, taken to the PACU in stable condition.  Plan: Given the sheer amount of ureteral dust material to pass, I left the stent without a tether.  I  will have him return to the office in about 2 weeks for cystoscopy, stent removal.  Vanna Scotland, M.D.

## 2023-10-19 NOTE — Anesthesia Procedure Notes (Signed)
Procedure Name: Intubation Date/Time: 10/19/2023 1:17 PM  Performed by: Lysbeth Penner, CRNAPre-anesthesia Checklist: Patient identified, Emergency Drugs available, Suction available and Patient being monitored Patient Re-evaluated:Patient Re-evaluated prior to induction Oxygen Delivery Method: Circle system utilized Preoxygenation: Pre-oxygenation with 100% oxygen Induction Type: IV induction Ventilation: Mask ventilation without difficulty LMA Size: 4.0 Laryngoscope Size: McGraph and 4 Grade View: Grade II Tube type: Oral Number of attempts: 1 Airway Equipment and Method: Stylet and Oral airway Placement Confirmation: ETT inserted through vocal cords under direct vision, positive ETCO2 and breath sounds checked- equal and bilateral Secured at: 22 cm Tube secured with: Tape Dental Injury: Teeth and Oropharynx as per pre-operative assessment

## 2023-10-19 NOTE — Anesthesia Preprocedure Evaluation (Signed)
Anesthesia Evaluation  Patient identified by MRN, date of birth, ID band Patient awake    Reviewed: Allergy & Precautions, H&P , NPO status , Patient's Chart, lab work & pertinent test results  History of Anesthesia Complications (+) PONV and history of anesthetic complications (childhood)  Airway Mallampati: IV  TM Distance: >3 FB Neck ROM: full    Dental no notable dental hx.    Pulmonary Current Smoker and Patient abstained from smoking.   Pulmonary exam normal        Cardiovascular hypertension, Normal cardiovascular exam     Neuro/Psych  PSYCHIATRIC DISORDERS Anxiety     negative neurological ROS     GI/Hepatic negative GI ROS, Neg liver ROS,,,  Endo/Other  diabetes, Type 2    Renal/GU      Musculoskeletal   Abdominal  (+) + obese  Peds  Hematology negative hematology ROS (+)   Anesthesia Other Findings Past Medical History: No date: Allergy No date: Anxiety No date: Complication of anesthesia No date: Diabetes mellitus without complication (HCC) No date: History of kidney stones No date: Hypertension No date: PONV (postoperative nausea and vomiting)  Past Surgical History: 03/15/2020: EXTRACORPOREAL SHOCK WAVE LITHOTRIPSY; Right     Comment:  Procedure: RIGHT EXTRACORPOREAL SHOCK WAVE LITHOTRIPSY               (ESWL);  Surgeon: Noel Christmas, MD;  Location:               [facility];  Service: Urology;                Laterality: Right; No date: KNEE ARTHROSCOPY     Comment:  right No date: wisdom teeth extracted     Reproductive/Obstetrics negative OB ROS                             Anesthesia Physical Anesthesia Plan  ASA: 2  Anesthesia Plan: General ETT   Post-op Pain Management: Toradol IV (intra-op)* and Ofirmev IV (intra-op)*   Induction: Intravenous  PONV Risk Score and Plan: 2 and Ondansetron, Dexamethasone and Midazolam  Airway  Management Planned: Oral ETT  Additional Equipment:   Intra-op Plan:   Post-operative Plan: Extubation in OR  Informed Consent: I have reviewed the patients History and Physical, chart, labs and discussed the procedure including the risks, benefits and alternatives for the proposed anesthesia with the patient or authorized representative who has indicated his/her understanding and acceptance.     Dental Advisory Given  Plan Discussed with: CRNA and Surgeon  Anesthesia Plan Comments:        Anesthesia Quick Evaluation

## 2023-10-19 NOTE — Discharge Instructions (Signed)
You have a ureteral stent in place.  This is a tube that extends from your kidney to your bladder.  This may cause urinary bleeding, burning with urination, and urinary frequency.  Please call our office or present to the ED if you develop fevers >101 or pain which is not able to be controlled with oral pain medications.  You may be given either Flomax and/ or ditropan to help with bladder spasms and stent pain in addition to pain medications.    [facility] [address], [address] [place], [place] [address] [phone] 

## 2023-10-19 NOTE — Transfer of Care (Signed)
Immediate Anesthesia Transfer of Care Note  Patient: Darren Calhoun  Procedure(s) Performed: CYSTOSCOPY/URETEROSCOPY/HOLMIUM LASER/STENT PLACEMENT (Right: Ureter)  Patient Location: PACU  Anesthesia Type:General  Level of Consciousness: drowsy  Airway & Oxygen Therapy: Patient Spontanous Breathing and Patient connected to face mask oxygen  Post-op Assessment: Report given to RN and Post -op Vital signs reviewed and stable  Post vital signs: Reviewed and stable  Last Vitals:  Vitals Value Taken Time  BP 146/66 10/19/23 1431  Temp    Pulse 90 10/19/23 1441  Resp 14 10/19/23 1441  SpO2 96 % 10/19/23 1441  Vitals shown include unfiled device data.  Last Pain:  Vitals:   10/19/23 1440  TempSrc:   PainSc: 0-No pain         Complications:  Encounter Notable Events  Notable Event Outcome Phase Comment  Desaturation < 90% for over 3 min or < 80% for over 1 min  Intraprocedure ; excessive thick secretions

## 2023-10-20 ENCOUNTER — Other Ambulatory Visit: Payer: Self-pay | Admitting: Urology

## 2023-10-20 ENCOUNTER — Encounter: Payer: Self-pay | Admitting: Urology

## 2023-10-20 MED ORDER — ONDANSETRON HCL 4 MG PO TABS: 4.0000 mg | ORAL_TABLET | Freq: Three times a day (TID) | ORAL | 0 refills | Status: DC | PRN

## 2023-10-20 NOTE — Anesthesia Postprocedure Evaluation (Signed)
Anesthesia Post Note  Patient: Darren Calhoun  Procedure(s) Performed: CYSTOSCOPY/URETEROSCOPY/HOLMIUM LASER/STENT PLACEMENT (Right: Ureter)  Patient location during evaluation: PACU Anesthesia Type: General Level of consciousness: awake and alert Pain management: pain level controlled Vital Signs Assessment: post-procedure vital signs reviewed and stable Respiratory status: spontaneous breathing, nonlabored ventilation and respiratory function stable Cardiovascular status: blood pressure returned to baseline and stable Postop Assessment: no apparent nausea or vomiting Anesthetic complications: yes   Encounter Notable Events  Notable Event Outcome Phase Comment  Desaturation < 90% for over 3 min or < 80% for over 1 min  Intraprocedure ; excessive thick secretions     Last Vitals:  Vitals:   10/19/23 1510 10/19/23 1525  BP:  125/85  Pulse: 82 85  Resp: 14 16  Temp: (!) 36.3 C 36.6 C  SpO2: 97% 93%    Last Pain:  Vitals:   10/19/23 1525  TempSrc: Temporal  PainSc: 0-No pain                 Foye Deer

## 2023-10-25 ENCOUNTER — Other Ambulatory Visit: Payer: Self-pay | Admitting: Urology

## 2023-10-26 ENCOUNTER — Other Ambulatory Visit: Payer: Self-pay | Admitting: Urology

## 2023-10-26 MED ORDER — HYDROCODONE-ACETAMINOPHEN 5-325 MG PO TABS: 1.0000 | ORAL_TABLET | Freq: Four times a day (QID) | ORAL | 0 refills | Status: DC | PRN

## 2023-11-03 ENCOUNTER — Encounter: Payer: Self-pay | Admitting: Urology

## 2023-11-03 ENCOUNTER — Ambulatory Visit: Payer: Medicaid Other | Admitting: Urology

## 2023-11-03 VITALS — BP 133/84 | HR 86 | Ht 69.0 in | Wt 225.0 lb

## 2023-11-03 DIAGNOSIS — Z466 Encounter for fitting and adjustment of urinary device: Secondary | ICD-10-CM | POA: Diagnosis not present

## 2023-11-03 DIAGNOSIS — Z792 Long term (current) use of antibiotics: Secondary | ICD-10-CM

## 2023-11-03 DIAGNOSIS — N200 Calculus of kidney: Secondary | ICD-10-CM

## 2023-11-03 MED ORDER — CEPHALEXIN 250 MG PO CAPS
500.0000 mg | ORAL_CAPSULE | Freq: Once | ORAL | Status: AC
  Administered 2023-11-03: 500 mg via ORAL

## 2023-11-04 LAB — URINALYSIS, COMPLETE
Bilirubin, UA: NEGATIVE
Glucose, UA: NEGATIVE
Ketones, UA: NEGATIVE
Nitrite, UA: NEGATIVE
Specific Gravity, UA: 1.02 (ref 1.005–1.030)
Urobilinogen, Ur: 0.2 mg/dL (ref 0.2–1.0)
pH, UA: 7 (ref 5.0–7.5)

## 2023-11-04 LAB — MICROSCOPIC EXAMINATION: RBC, Urine: >30 /[HPF] — AB (ref 0–2)

## 2023-11-04 NOTE — Progress Notes (Signed)
   11/03/23  CC:  Chief Complaint  Patient presents with   Procedure    stent    HPI: 53 year old male with fairly large burden right nephrolithiasis status post ureteroscopy presents today for cystoscopy, stent removal.  Urinalysis today is consistent with known ureteral stent, no concern for infection  Blood pressure 133/84, pulse 86, height 5\' 9"  (1.753 m), weight 225 lb (102.1 kg). NED. A&Ox3.   No respiratory distress   Abd soft, NT, ND Normal phallus with bilateral descended testicles  Cystoscopy/ Stent removal procedure  Patient identification was confirmed, informed consent was obtained, and patient was prepped using Betadine solution.  Lidocaine jelly was administered per urethral meatus.    Preoperative abx where received prior to procedure.    Procedure: - Flexible cystoscope introduced, without any difficulty.   - Thorough search of the bladder revealed:    normal urethral meatus  Stent seen emanating from right ureteral orifice, grasped with stent graspers, and removed in entirety.     Post-Procedure: - Patient tolerated the procedure well  Post-Procedure: - Patient tolerated the procedure well  Assessment/ Plan:  1. Right nephrolithiasis Status post ureteroscopy  Stent removed today without difficulty  Discussed warning symptoms  Follow-up in 6 weeks with renal ultrasound prior - Urinalysis, Complete - Ultrasound renal complete; Future  2. Prophylactic antibiotic - cephALEXin (KEFLEX) capsule 500 mg - Ultrasound renal complete; Future  Vanna Scotland, MD

## 2023-11-11 ENCOUNTER — Other Ambulatory Visit: Payer: Self-pay | Admitting: Urology

## 2023-11-11 ENCOUNTER — Other Ambulatory Visit: Payer: Self-pay | Admitting: Family Medicine

## 2023-11-14 ENCOUNTER — Other Ambulatory Visit: Payer: Self-pay | Admitting: Urology

## 2023-11-26 ENCOUNTER — Other Ambulatory Visit: Payer: Self-pay | Admitting: Family Medicine

## 2023-11-26 DIAGNOSIS — F411 Generalized anxiety disorder: Secondary | ICD-10-CM

## 2023-11-27 ENCOUNTER — Other Ambulatory Visit: Payer: Self-pay | Admitting: Family Medicine

## 2023-11-30 ENCOUNTER — Telehealth: Payer: Self-pay

## 2023-11-30 ENCOUNTER — Other Ambulatory Visit: Payer: Self-pay | Admitting: Family Medicine

## 2023-11-30 DIAGNOSIS — E119 Type 2 diabetes mellitus without complications: Secondary | ICD-10-CM

## 2023-11-30 MED ORDER — METFORMIN HCL 500 MG PO TABS: 500.0000 mg | ORAL_TABLET | ORAL | 0 refills | Status: DC

## 2023-11-30 NOTE — Telephone Encounter (Signed)
Rx sent to pt pharmacy 

## 2023-11-30 NOTE — Telephone Encounter (Signed)
Copied from CRM [id number]. Topic: Clinical - Medication Refill >> Nov 30, 2023 11:23 AM Steele Sizer wrote: Most Recent Primary Care Visit:  Provider: Gershon Crane A  Department: [facility]  Visit Type: OFFICE VISIT  Date: 06/09/2023  Medication:metFORMIN   Has the patient contacted their pharmacy? No (Agent: If no, request that the patient contact the pharmacy for the refill. If patient does not wish to contact the pharmacy document the reason why and proceed with request.) (Agent: If yes, when and what did the pharmacy advise?)  Is this the correct pharmacy for this prescription? Yes If no, delete pharmacy and type the correct one.  This is the patient's preferred pharmacy:  [facility] [id number] [place], [place] - [address] [place] [address] Phone: [phone] Fax: [phone]   Has the prescription been filled recently? No  Is the patient out of the medication? No  Has the patient been seen for an appointment in the last year OR does the patient have an upcoming appointment?   Can we respond through MyChart?   Agent: Please be advised that Rx refills may take up to 3 business days. We ask that you follow-up with your pharmacy.

## 2023-11-30 NOTE — Telephone Encounter (Signed)
Copied from CRM [id number]. Topic: Clinical - Prescription Issue >> Nov 30, 2023 11:54 AM Theodis Sato wrote: Reason for CRM: PT states his pharmacy states the clinic has denied his amLODipine-valsartan (EXFORGE) 5-160 MG tablet. Pt has 2 days left of this medication. The prescription for metFORMIN (GLUCOPHAGE) 500 MG tablet  has expired, please send a new prescription to the PT's pharmacy.

## 2023-12-05 ENCOUNTER — Other Ambulatory Visit: Payer: Self-pay | Admitting: Family Medicine

## 2023-12-21 ENCOUNTER — Encounter: Payer: Self-pay | Admitting: *Deleted

## 2023-12-22 ENCOUNTER — Ambulatory Visit: Payer: Medicaid Other | Admitting: Urology

## 2023-12-24 ENCOUNTER — Ambulatory Visit: Payer: Medicaid Other

## 2023-12-28 ENCOUNTER — Ambulatory Visit
Admission: RE | Admit: 2023-12-28 | Discharge: 2023-12-28 | Disposition: A | Discharge status: Home / Self Care | Payer: Medicaid Other | Source: Ambulatory Visit | Attending: Urology | Admitting: Urology

## 2023-12-28 DIAGNOSIS — Z792 Long term (current) use of antibiotics: Secondary | ICD-10-CM | POA: Insufficient documentation

## 2023-12-28 DIAGNOSIS — N132 Hydronephrosis with renal and ureteral calculous obstruction: Secondary | ICD-10-CM | POA: Diagnosis not present

## 2023-12-28 DIAGNOSIS — N200 Calculus of kidney: Secondary | ICD-10-CM | POA: Diagnosis present

## 2024-01-26 ENCOUNTER — Ambulatory Visit: Payer: Medicaid Other | Admitting: Urology

## 2024-01-26 VITALS — BP 120/69 | HR 90

## 2024-01-26 DIAGNOSIS — N200 Calculus of kidney: Secondary | ICD-10-CM

## 2024-01-26 DIAGNOSIS — Z09 Encounter for follow-up examination after completed treatment for conditions other than malignant neoplasm: Secondary | ICD-10-CM

## 2024-01-26 DIAGNOSIS — Z87442 Personal history of urinary calculi: Secondary | ICD-10-CM

## 2024-01-26 NOTE — Progress Notes (Signed)
I,Amy L Pierron,acting as a scribe for Vanna Scotland, MD.,have documented all relevant documentation on the behalf of Vanna Scotland, MD,as directed by  Vanna Scotland, MD while in the presence of Vanna Scotland, MD.  01/26/2024 4:12 PM   [name] [birth year] [id number]  Referring provider: Nelwyn Salisbury, MD [address] [place],  [place] [address]  Chief Complaint  Patient presents with   Follow-up    HPI: 53 year-old male who presents today following a ureteroscopy.  He had a renal ultrasound on 12/28/2023 that showed some mild bilateral pelviectasis. The stone burden on the right included a 1.8 cm and an 8 mm. Both the stones were obliterated/ dusted.  He knows he needs to increase his water intake. He also smokes 1 cigar a day.   PMH: Past Medical History:  Diagnosis Date   Allergy    Anxiety    Complication of anesthesia    Diabetes mellitus without complication (HCC)    History of kidney stones    Hypertension    PONV (postoperative nausea and vomiting)     Surgical History: Past Surgical History:  Procedure Laterality Date   CYSTOSCOPY/URETEROSCOPY/HOLMIUM LASER/STENT PLACEMENT Right 10/19/2023   Procedure: CYSTOSCOPY/URETEROSCOPY/HOLMIUM LASER/STENT PLACEMENT;  Surgeon: Vanna Scotland, MD;  Location: [facility];  Service: Urology;  Laterality: Right;   EXTRACORPOREAL SHOCK WAVE LITHOTRIPSY Right 03/15/2020   Procedure: RIGHT EXTRACORPOREAL SHOCK WAVE LITHOTRIPSY (ESWL);  Surgeon: Noel Christmas, MD;  Location: [facility];  Service: Urology;  Laterality: Right;   KNEE ARTHROSCOPY     right   wisdom teeth extracted      Home Medications:  Allergies as of 01/26/2024       Reactions   Ciprofloxacin    Nightmares         Medication List        Accurate as of January 26, 2024  4:12 PM. If you have any questions, ask your nurse or doctor.          amLODipine-valsartan 5-160 MG tablet Commonly known as: EXFORGE TAKE 1  TABLET BY MOUTH DAILY   Apple Cider Vinegar 500 MG Tabs Take 500 mg by mouth 2 (two) times daily.   Ashwagandha 300 MG Tabs Take 600 mg by mouth daily.   COD LIVER OIL PO Take 1,000 mg by mouth daily.   [facility] Allergy Relief D 5-120 MG tablet Generic drug: cetirizine-pseudoephedrine TAKE 1 TABLET TWICE DAILY   Digestive Enzyme Caps Take 1 capsule by mouth 2 (two) times daily.   fluticasone 50 MCG/ACT nasal spray Commonly known as: FLONASE INHALE 2 SPRAYS INTO THE NOSE DAILY. What changed:  how much to take when to take this reasons to take this   GLUCOSAMINE PO Take 1 tablet by mouth daily.   HYDROcodone-acetaminophen 5-325 MG tablet Commonly known as: NORCO/VICODIN Take 1-2 tablets by mouth every 6 (six) hours as needed for moderate pain (pain score 4-6).   metFORMIN 500 MG tablet Commonly known as: GLUCOPHAGE Take 1 tablet (500 mg total) by mouth See admin instructions. Take 500 mg daily, may take a second 500 mg dose as needed for high blood sugar   ondansetron 4 MG tablet Commonly known as: Zofran Take 1 tablet (4 mg total) by mouth every 8 (eight) hours as needed for nausea or vomiting.   oxybutynin 5 MG tablet Commonly known as: DITROPAN Take 1 tablet (5 mg total) by mouth every 8 (eight) hours as needed for bladder spasms.   PROBIOTIC PO  Take 1 capsule by mouth daily.   Saw Palmetto 160 MG Caps Take 160 mg by mouth daily.   sertraline 100 MG tablet Commonly known as: ZOLOFT TAKE 1 TABLET BY MOUTH DAILY   tamsulosin 0.4 MG Caps capsule Commonly known as: Flomax Take 1 capsule (0.4 mg total) by mouth daily.        Allergies:  Allergies  Allergen Reactions   Ciprofloxacin     Nightmares      Family History: Family History  Problem Relation Age of Onset   Diabetes Other    Hyperlipidemia Other    Hypertension Other    Alcohol abuse Other    Depression Other    Colon cancer Maternal Grandfather     Social History:  reports that he has  been smoking cigars. He has never used smokeless tobacco. He reports that he does not drink alcohol and does not use drugs.   Physical Exam: BP 120/69   Pulse 90   Constitutional:  Alert and oriented, No acute distress. HEENT: [place] AT, moist mucus membranes.  Trachea midline, no masses. Neurologic: Grossly intact, no focal deficits, moving all 4 extremities. Psychiatric: Normal mood and affect.   Pertinent Imaging: Narrative & Impression  CLINICAL DATA:  Nephrolithiasis   EXAM: RENAL / URINARY TRACT ULTRASOUND COMPLETE   COMPARISON:  Abdominal radiograph 10/02/2023   FINDINGS: Right Kidney:   Renal measurements: 13.1 x 6.9 x 5.6 cm = volume: 262 mL. Normal renal cortical thickness and echogenicity. Mild pelviectasis. No mass.   Left Kidney:   Renal measurements: 12.4 x 6.1 x 5.4 cm = volume: 215.5 mL. Normal renal cortical thickness and echogenicity. Mild pelviectasis. 5 mm stone.   Bladder:   Appears normal for degree of bladder distention.   Other:   None.   IMPRESSION: 1. Mild bilateral pelviectasis. 2. Left nephrolithiasis.   Electronically Signed   By: Annia Belt M.D.   On: 12/28/2023 19:19  Personally reviewed the above scan and agree with radiologic interpretation.    Assessment & Plan:    1. Kidney stones  - S/p ureteroscopy. No residual hydronephrosis.  -Although renal ultrasound indicates a left-sided stone, this is not appreciated on his cross-sectional imaging is probably artifactual.  He is reassured.  - We discussed general stone prevention techniques including drinking plenty water with goal of producing 2.5 L urine daily, increased citric acid intake, avoidance of high oxalate containing foods, and decreased salt intake.  Information about dietary recommendations given today.   - Plan to repeat a KUB in 6 months instead of a year per his request to assess if he is growing more stones.   Return in about 6 months (around 07/25/2024) for  KUB.  I have reviewed the above documentation for accuracy and completeness, and I agree with the above.   Vanna Scotland, MD   [facility] [address], [address] [place], [place] [address] [phone]

## 2024-02-12 ENCOUNTER — Other Ambulatory Visit: Payer: Self-pay | Admitting: Family Medicine

## 2024-02-12 NOTE — Telephone Encounter (Signed)
 Copied from CRM [id number]. Topic: Clinical - Medication Refill >> Feb 12, 2024  4:18 PM Florestine Avers wrote: Most Recent Primary Care Visit:  Provider: Gershon Crane A  Department: [facility]  Visit Type: OFFICE VISIT  Date: 06/09/2023  Medication: amLODipine-valsartan (EXFORGE) 5-160 MG tablet  Has the patient contacted their pharmacy? Yes (Agent: If no, request that the patient contact the pharmacy for the refill. If patient does not wish to contact the pharmacy document the reason why and proceed with request.) (Agent: If yes, when and what did the pharmacy advise?)  Is this the correct pharmacy for this prescription? Yes If no, delete pharmacy and type the correct one.  This is the patient's preferred pharmacy:  [facility] [id number] - [place], [place] - [address] [place] [address] Phone: [phone] Fax: [phone]  [id number] - [place], [place] - [address] [place] [address] Phone: [phone] Fax: [phone]   Has the prescription been filled recently? No  Is the patient out of the medication? No  Has the patient been seen for an appointment in the last year OR does the patient have an upcoming appointment? Yes  Can we respond through MyChart? Yes  Agent: Please be advised that Rx refills may take up to 3 business days. We ask that you follow-up with your pharmacy.

## 2024-02-16 MED ORDER — AMLODIPINE BESYLATE-VALSARTAN 5-160 MG PO TABS: 1.0000 | ORAL_TABLET | Freq: Every day | ORAL | 0 refills | Status: DC

## 2024-02-24 ENCOUNTER — Other Ambulatory Visit: Payer: Self-pay | Admitting: Family Medicine

## 2024-05-03 ENCOUNTER — Telehealth: Payer: Self-pay | Admitting: Family Medicine

## 2024-05-03 NOTE — Telephone Encounter (Unsigned)
 Copied from CRM [id number]. Topic: Clinical - Medication Refill >> May 03, 2024 12:54 PM Valeri Gate H wrote: Medication: amLODipine -valsartan  (EXFORGE ) 5-160 MG tablet  Has the patient contacted their pharmacy? No (Agent: If no, request that the patient contact the pharmacy for the refill. If patient does not wish to contact the pharmacy document the reason why and proceed with request.) (Agent: If yes, when and what did the pharmacy advise?)  This is the patient's preferred pharmacy:  [facility] [id number] [place], [place] - [address] [place] [address] Phone: [phone] Fax: [phone]   Is this the correct pharmacy for this prescription? Yes If no, delete pharmacy and type the correct one.   Has the prescription been filled recently? No  Is the patient out of the medication? No  Has the patient been seen for an appointment in the last year OR does the patient have an upcoming appointment? Yes  Can we respond through MyChart? Yes  Agent: Please be advised that Rx refills may take up to 3 business days. We ask that you follow-up with your pharmacy.

## 2024-05-04 MED ORDER — AMLODIPINE BESYLATE-VALSARTAN 5-160 MG PO TABS: 1.0000 | ORAL_TABLET | Freq: Every day | ORAL | 0 refills | Status: DC

## 2024-05-10 ENCOUNTER — Encounter: Payer: Self-pay | Admitting: Family Medicine

## 2024-05-10 ENCOUNTER — Ambulatory Visit (INDEPENDENT_AMBULATORY_CARE_PROVIDER_SITE_OTHER): Admitting: Family Medicine

## 2024-05-10 VITALS — BP 110/70 | HR 73 | Temp 98.4°F | Ht 66.5 in | Wt 214.0 lb

## 2024-05-10 DIAGNOSIS — E119 Type 2 diabetes mellitus without complications: Secondary | ICD-10-CM

## 2024-05-10 DIAGNOSIS — N138 Other obstructive and reflux uropathy: Secondary | ICD-10-CM

## 2024-05-10 DIAGNOSIS — F419 Anxiety disorder, unspecified: Secondary | ICD-10-CM | POA: Diagnosis not present

## 2024-05-10 DIAGNOSIS — Z7984 Long term (current) use of oral hypoglycemic drugs: Secondary | ICD-10-CM | POA: Diagnosis not present

## 2024-05-10 DIAGNOSIS — N529 Male erectile dysfunction, unspecified: Secondary | ICD-10-CM | POA: Insufficient documentation

## 2024-05-10 DIAGNOSIS — N401 Enlarged prostate with lower urinary tract symptoms: Secondary | ICD-10-CM | POA: Diagnosis not present

## 2024-05-10 DIAGNOSIS — E785 Hyperlipidemia, unspecified: Secondary | ICD-10-CM

## 2024-05-10 DIAGNOSIS — F411 Generalized anxiety disorder: Secondary | ICD-10-CM

## 2024-05-10 DIAGNOSIS — K589 Irritable bowel syndrome without diarrhea: Secondary | ICD-10-CM | POA: Diagnosis not present

## 2024-05-10 DIAGNOSIS — I10 Essential (primary) hypertension: Secondary | ICD-10-CM | POA: Diagnosis not present

## 2024-05-10 LAB — LIPID PANEL
Cholesterol: 210 mg/dL — ABNORMAL HIGH (ref 0–200)
HDL: 34.5 mg/dL — ABNORMAL LOW (ref >39.00)
LDL Cholesterol: 137 mg/dL — ABNORMAL HIGH (ref 0–99)
NonHDL: 175.45
Total CHOL/HDL Ratio: 6
Triglycerides: 194 mg/dL — ABNORMAL HIGH (ref 0.0–149.0)
VLDL: 38.8 mg/dL (ref 0.0–40.0)

## 2024-05-10 LAB — HEMOGLOBIN A1C: Hgb A1c MFr Bld: 6.6 % — ABNORMAL HIGH (ref 4.6–6.5)

## 2024-05-10 LAB — PSA: PSA: 0.68 ng/mL (ref 0.10–4.00)

## 2024-05-10 LAB — CBC WITH DIFFERENTIAL/PLATELET
Basophils Absolute: 0.1 10*3/uL (ref 0.0–0.1)
Basophils Relative: 0.7 % (ref 0.0–3.0)
Eosinophils Absolute: 0.3 10*3/uL (ref 0.0–0.7)
Eosinophils Relative: 3.5 % (ref 0.0–5.0)
HCT: 47 % (ref 39.0–52.0)
Hemoglobin: 15.8 g/dL (ref 13.0–17.0)
Lymphocytes Relative: 27.1 % (ref 12.0–46.0)
Lymphs Abs: 2 10*3/uL (ref 0.7–4.0)
MCHC: 33.7 g/dL (ref 30.0–36.0)
MCV: 89.1 fl (ref 78.0–100.0)
Monocytes Absolute: 0.8 10*3/uL (ref 0.1–1.0)
Monocytes Relative: 10.6 % (ref 3.0–12.0)
Neutro Abs: 4.2 10*3/uL (ref 1.4–7.7)
Neutrophils Relative %: 58.1 % (ref 43.0–77.0)
Platelets: 246 10*3/uL (ref 150.0–400.0)
RBC: 5.27 Mil/uL (ref 4.22–5.81)
RDW: 13 % (ref 11.5–15.5)
WBC: 7.3 10*3/uL (ref 4.0–10.5)

## 2024-05-10 LAB — TESTOSTERONE: Testosterone: 267.5 ng/dL — ABNORMAL LOW (ref 300.00–890.00)

## 2024-05-10 LAB — BASIC METABOLIC PANEL WITH GFR
BUN: 22 mg/dL (ref 6–23)
CO2: 27 meq/L (ref 19–32)
Calcium: 9.5 mg/dL (ref 8.4–10.5)
Chloride: 102 meq/L (ref 96–112)
Creatinine, Ser: 1.07 mg/dL (ref 0.40–1.50)
GFR: 80.46 mL/min (ref >60.00)
Glucose, Bld: 122 mg/dL — ABNORMAL HIGH (ref 70–99)
Potassium: 4 meq/L (ref 3.5–5.1)
Sodium: 138 meq/L (ref 135–145)

## 2024-05-10 LAB — HEPATIC FUNCTION PANEL
ALT: 21 U/L (ref 0–53)
AST: 14 U/L (ref 0–37)
Albumin: 4.5 g/dL (ref 3.5–5.2)
Alkaline Phosphatase: 41 U/L (ref 39–117)
Bilirubin, Direct: 0.1 mg/dL (ref 0.0–0.3)
Total Bilirubin: 0.4 mg/dL (ref 0.2–1.2)
Total Protein: 7 g/dL (ref 6.0–8.3)

## 2024-05-10 LAB — TSH: TSH: 3.2 u[IU]/mL (ref 0.35–5.50)

## 2024-05-10 MED ORDER — AMLODIPINE BESYLATE-VALSARTAN 5-160 MG PO TABS: 1.0000 | ORAL_TABLET | Freq: Every day | ORAL | 3 refills | Status: DC

## 2024-05-10 MED ORDER — METFORMIN HCL 500 MG PO TABS: 500.0000 mg | ORAL_TABLET | Freq: Every day | ORAL | 3 refills | Status: AC

## 2024-05-10 MED ORDER — SERTRALINE HCL 100 MG PO TABS: 100.0000 mg | ORAL_TABLET | Freq: Every day | ORAL | 3 refills | Status: AC

## 2024-05-10 MED ORDER — SILDENAFIL CITRATE 100 MG PO TABS: 100.0000 mg | ORAL_TABLET | Freq: Every day | ORAL | 11 refills | Status: AC | PRN

## 2024-05-10 NOTE — Progress Notes (Signed)
 Subjective:    Patient ID: Darren Calhoun, male    DOB: 1971-11-21, 53 y.o.   MRN: [id number]  HPI Here to follow up on issues. He had a lithotripsy last year for left sided kidney stones, but he continued to have occasional pain and hematuria. He then began to see Dr. Dustin Gimenez of [facility], and in October he had a right ureteroscopy to clean out debris and a stent was placed. The stent was removed a week later. This procedure was successful, and he now has no urinary symptoms. An US  also revealed a 5mm stone in the left kidney that they will monitor. Otherwise his glucoses have been well contolled, and he has lost 5 lbs. His BP is stable. His IBS is stable. He does complain of trouble getting and maintaining erections. No change in libido.    Review of Systems  Constitutional: Negative.   HENT: Negative.    Eyes: Negative.   Respiratory: Negative.    Cardiovascular: Negative.   Gastrointestinal: Negative.   Genitourinary: Negative.   Musculoskeletal: Negative.   Skin: Negative.   Neurological: Negative.   Psychiatric/Behavioral: Negative.         Objective:   Physical Exam Constitutional:      General: He is not in acute distress.    Appearance: Normal appearance. He is well-developed. He is not diaphoretic.  HENT:     Head: Normocephalic and atraumatic.     Right Ear: External ear normal.     Left Ear: External ear normal.     Nose: Nose normal.     Mouth/Throat:     Pharynx: No oropharyngeal exudate.  Eyes:     General: No scleral icterus.       Right eye: No discharge.        Left eye: No discharge.     Conjunctiva/sclera: Conjunctivae normal.     Pupils: Pupils are equal, round, and reactive to light.  Neck:     Thyroid : No thyromegaly.     Vascular: No JVD.     Trachea: No tracheal deviation.  Cardiovascular:     Rate and Rhythm: Normal rate and regular rhythm.     Pulses: Normal pulses.     Heart sounds: Normal heart sounds. No murmur heard.     No friction rub. No gallop.  Pulmonary:     Effort: Pulmonary effort is normal. No respiratory distress.     Breath sounds: Normal breath sounds. No wheezing or rales.  Chest:     Chest wall: No tenderness.  Abdominal:     General: Bowel sounds are normal. There is no distension.     Palpations: Abdomen is soft. There is no mass.     Tenderness: There is no abdominal tenderness. There is no guarding or rebound.  Genitourinary:    Penis: Normal. No tenderness.      Testes: Normal.     Prostate: Normal.     Rectum: Normal. Guaiac result negative.  Musculoskeletal:        General: No tenderness. Normal range of motion.     Cervical back: Neck supple.  Lymphadenopathy:     Cervical: No cervical adenopathy.  Skin:    General: Skin is warm and dry.     Coloration: Skin is not pale.     Findings: No erythema or rash.  Neurological:     General: No focal deficit present.     Mental Status: He is alert and oriented to person, place, and  time.     Cranial Nerves: No cranial nerve deficit.     Motor: No abnormal muscle tone.     Coordination: Coordination normal.     Deep Tendon Reflexes: Reflexes are normal and symmetric. Reflexes normal.  Psychiatric:        Mood and Affect: Mood normal.        Behavior: Behavior normal.        Thought Content: Thought content normal.        Judgment: Judgment normal.           Assessment & Plan:  He was recently treated for kidney stones, but after the procedures above he has felt well. His HTN and diabetes have been stable. His anxiety and IBS are stable. We will get labs to check an A1c, lipids, etc. He declines a colonoscopy.  We spent a total of (35   ) minutes reviewing records and discussing these issues.  Corita Diego, MD

## 2024-05-12 ENCOUNTER — Ambulatory Visit: Payer: Self-pay | Admitting: Family Medicine

## 2024-05-12 MED ORDER — TESTOSTERONE CYPIONATE 200 MG/ML IM SOLN: 200.0000 mg | INTRAMUSCULAR | 1 refills | Status: DC

## 2024-05-12 MED ORDER — ATORVASTATIN CALCIUM 10 MG PO TABS
10.0000 mg | ORAL_TABLET | Freq: Every day | ORAL | 3 refills | Status: AC
Start: 2024-05-12 — End: ?

## 2024-05-12 MED ORDER — LUER LOCK SAFETY SYRINGES 22G X 1-1/2" 3 ML MISC: 1.0000 | 2 refills | Status: AC

## 2024-05-12 NOTE — Addendum Note (Signed)
 Addended by: Corita Diego A on: 05/12/2024 01:16 PM   Modules accepted: Orders

## 2024-05-18 ENCOUNTER — Telehealth: Payer: Self-pay

## 2024-05-18 NOTE — Telephone Encounter (Signed)
 Copied from CRM [id number]. Topic: Clinical - Lab/Test Results >> May 18, 2024 11:04 AM Lajean Pike wrote: Reason for CRM: Patient returned call. Relayed lab results. Patient has no additional questions. Stated he would call back in.

## 2024-05-18 NOTE — Telephone Encounter (Signed)
 Noted

## 2024-07-19 NOTE — Progress Notes (Signed)
 07/20/2024 2:22 PM   Darren Calhoun 06-30-1971 [id number]  Referring provider: Johnny Garnette LABOR, MD [address] [place],  [place] [address]  Urological history: 1. Nephrolithiasis  - right URS (2024)   Chief Complaint  Patient presents with   Follow-up   Nephrolithiasis   HPI: Darren Calhoun is a 53 y.o. man who presents today for kidney stones.    Previous records reviewed.   He feels well today.  Patient denies any modifying or aggravating factors.  Patient denies any recent UTI's, gross hematuria, dysuria or suprapubic/flank pain.  Patient denies any fevers, chills, nausea or vomiting.    PSA screening up to date, 0.68 in June/2025.    Serum creatinine (05/2024) 1.07, eGFR 80.46  KUB no stones seen, radiologist interpretation still pending   Lab Results  Component Value Date   WBC 7.3 05/10/2024   HGB 15.8 05/10/2024   HCT 47.0 05/10/2024   MCV 89.1 05/10/2024   PLT 246.0 05/10/2024    Lab Results  Component Value Date   CREATININE 1.07 05/10/2024    Lab Results  Component Value Date   PSA 0.68 05/10/2024   PSA 0.83 11/05/2022   PSA 0.65 ng/ml 02/08/2020    Lab Results  Component Value Date   TESTOSTERONE  267.50 (L) 05/10/2024    Lab Results  Component Value Date   HGBA1C 6.6 (H) 05/10/2024   PMH: Past Medical History:  Diagnosis Date   Allergy    Anxiety    Complication of anesthesia    Diabetes mellitus without complication (HCC)    History of kidney stones    Hypertension    PONV (postoperative nausea and vomiting)     Surgical History: Past Surgical History:  Procedure Laterality Date   CYSTOSCOPY/URETEROSCOPY/HOLMIUM LASER/STENT PLACEMENT Right 10/19/2023   Procedure: CYSTOSCOPY/URETEROSCOPY/HOLMIUM LASER/STENT PLACEMENT;  Surgeon: Penne Knee, MD;  Location: [facility];  Service: Urology;  Laterality: Right;   EXTRACORPOREAL SHOCK WAVE LITHOTRIPSY Right 03/15/2020   Procedure: RIGHT EXTRACORPOREAL SHOCK WAVE  LITHOTRIPSY (ESWL);  Surgeon: Elisabeth Valli BIRCH, MD;  Location: [facility];  Service: Urology;  Laterality: Right;   KNEE ARTHROSCOPY     right   wisdom teeth extracted      Home Medications:  Allergies as of 07/20/2024       Reactions   Ciprofloxacin     Nightmares         Medication List        Accurate as of July 20, 2024  2:22 PM. If you have any questions, ask your nurse or doctor.          amLODipine -valsartan  5-160 MG tablet Commonly known as: EXFORGE  Take 1 tablet by mouth daily.   Apple Cider Vinegar 500 MG Tabs Take 500 mg by mouth 2 (two) times daily.   Ashwagandha 300 MG Tabs Take 600 mg by mouth daily.   atorvastatin  10 MG tablet Commonly known as: LIPITOR Take 1 tablet (10 mg total) by mouth daily.   COD LIVER OIL PO Take 1,000 mg by mouth daily.   CVS Allergy Relief D 5-120 MG tablet Generic drug: cetirizine -pseudoephedrine  TAKE 1 TABLET TWICE DAILY   Digestive Enzyme Caps Take 1 capsule by mouth 2 (two) times daily.   fluticasone  50 MCG/ACT nasal spray Commonly known as: FLONASE  INHALE 2 SPRAYS INTO THE NOSE DAILY. What changed:  how much to take when to take this reasons to take this   GLUCOSAMINE PO Take 1 tablet by mouth daily.  Luer Lock Safety Syringes 22G X 1-1/2 3 ML Misc Generic drug: SYRINGE-NEEDLE (DISP) 3 ML 1 Application by Does not apply route every 14 (fourteen) days.   metFORMIN  500 MG tablet Commonly known as: GLUCOPHAGE  Take 1 tablet (500 mg total) by mouth daily with breakfast. Take 500 mg daily, may take a second 500 mg dose as needed for high blood sugar   PROBIOTIC PO Take 1 capsule by mouth daily.   Saw Palmetto 160 MG Caps Take 160 mg by mouth daily.   sertraline  100 MG tablet Commonly known as: ZOLOFT  Take 1 tablet (100 mg total) by mouth daily.   sildenafil  100 MG tablet Commonly known as: Viagra  Take 1 tablet (100 mg total) by mouth daily as needed for erectile dysfunction.    testosterone  cypionate 200 MG/ML injection Commonly known as: DEPOTESTOSTERONE CYPIONATE Inject 1 mL (200 mg total) into the muscle every 14 (fourteen) days.        Allergies:  Allergies  Allergen Reactions   Ciprofloxacin      Nightmares      Family History: Family History  Problem Relation Age of Onset   Diabetes Other    Hyperlipidemia Other    Hypertension Other    Alcohol abuse Other    Depression Other    Colon cancer Maternal Grandfather     Social History: See HPI for pertinent social history  ROS: Pertinent ROS in HPI  Physical Exam: BP (!) 137/90 (BP Location: Left Arm, Patient Position: Sitting, Cuff Size: Large)   Pulse 84   Ht 5' 10 (1.778 m)   Wt 214 lb 14.4 oz (97.5 kg)   SpO2 97%   BMI 30.83 kg/m   Constitutional:  Well nourished. Alert and oriented, No acute distress. HEENT: [place] AT, moist mucus membranes.  Trachea midline Cardiovascular: No clubbing, cyanosis, or edema. Respiratory: Normal respiratory effort, no increased work of breathing. Neurologic: Grossly intact, no focal deficits, moving all 4 extremities. Psychiatric: Normal mood and affect.  Laboratory Data: See EPIC and HPI  I have reviewed the labs.   Pertinent Imaging: See EPIC and HPI I have independently reviewed the films.    Assessment & Plan:    1. Nephrolithiasis - KUB no stones seen, radiology is interpretation still pending - discussed obtaining a 24 hour urine, but he deferred - Encouraged continual drinking of water fortified with lemon juice  Return in about 1 year (around 07/20/2025) for KUB and office visit .  These notes generated with voice recognition software. I apologize for typographical errors.  CLOTILDA HELON RIGGERS  [facility] [address]  [address] [place], [place] [address] [phone]

## 2024-07-20 ENCOUNTER — Ambulatory Visit (INDEPENDENT_AMBULATORY_CARE_PROVIDER_SITE_OTHER): Payer: Medicaid Other | Admitting: Urology

## 2024-07-20 ENCOUNTER — Encounter: Payer: Self-pay | Admitting: Urology

## 2024-07-20 ENCOUNTER — Other Ambulatory Visit: Payer: Self-pay | Admitting: Urology

## 2024-07-20 ENCOUNTER — Ambulatory Visit: Payer: Medicaid Other | Admitting: Urology

## 2024-07-20 ENCOUNTER — Ambulatory Visit
Admission: RE | Admit: 2024-07-20 | Discharge: 2024-07-20 | Disposition: A | Discharge status: Home / Self Care | Source: Ambulatory Visit | Attending: Urology | Admitting: Urology

## 2024-07-20 VITALS — BP 137/90 | HR 84 | Ht 70.0 in | Wt 214.9 lb

## 2024-07-20 DIAGNOSIS — N200 Calculus of kidney: Secondary | ICD-10-CM

## 2024-07-20 DIAGNOSIS — R109 Unspecified abdominal pain: Secondary | ICD-10-CM | POA: Diagnosis not present

## 2024-07-20 DIAGNOSIS — I878 Other specified disorders of veins: Secondary | ICD-10-CM | POA: Diagnosis not present

## 2024-07-20 DIAGNOSIS — Z87442 Personal history of urinary calculi: Secondary | ICD-10-CM | POA: Diagnosis not present

## 2024-07-25 LAB — HM DIABETES EYE EXAM

## 2024-09-01 ENCOUNTER — Other Ambulatory Visit: Payer: Self-pay

## 2024-09-01 MED ORDER — AMLODIPINE BESYLATE-VALSARTAN 5-160 MG PO TABS: 1.0000 | ORAL_TABLET | Freq: Every day | ORAL | 3 refills | Status: AC

## 2024-10-05 ENCOUNTER — Encounter: Payer: Self-pay | Admitting: Family Medicine

## 2024-10-06 NOTE — Telephone Encounter (Signed)
 Set up an in person OV for me to check this

## 2024-11-21 ENCOUNTER — Other Ambulatory Visit

## 2024-11-21 ENCOUNTER — Telehealth: Payer: Self-pay | Admitting: Family Medicine

## 2024-11-21 DIAGNOSIS — E785 Hyperlipidemia, unspecified: Secondary | ICD-10-CM

## 2024-11-21 DIAGNOSIS — E119 Type 2 diabetes mellitus without complications: Secondary | ICD-10-CM | POA: Diagnosis not present

## 2024-11-21 DIAGNOSIS — N529 Male erectile dysfunction, unspecified: Secondary | ICD-10-CM

## 2024-11-21 LAB — LIPID PANEL
Cholesterol: 178 mg/dL (ref 0–200)
HDL: 28.7 mg/dL — ABNORMAL LOW (ref >39.00)
LDL Cholesterol: 108 mg/dL — ABNORMAL HIGH (ref 0–99)
NonHDL: 148.98
Total CHOL/HDL Ratio: 6
Triglycerides: 206 mg/dL — ABNORMAL HIGH (ref 0.0–149.0)
VLDL: 41.2 mg/dL — ABNORMAL HIGH (ref 0.0–40.0)

## 2024-11-21 LAB — TESTOSTERONE: Testosterone: 213.37 ng/dL — ABNORMAL LOW (ref 300.00–890.00)

## 2024-11-21 LAB — HEMOGLOBIN A1C: Hgb A1c MFr Bld: 6.6 % — ABNORMAL HIGH (ref 4.6–6.5)

## 2024-11-21 NOTE — Telephone Encounter (Signed)
 Done

## 2024-11-22 ENCOUNTER — Ambulatory Visit: Payer: Self-pay | Admitting: Family Medicine

## 2024-11-22 ENCOUNTER — Telehealth: Payer: Self-pay | Admitting: *Deleted

## 2024-11-22 MED ORDER — TESTOSTERONE CYPIONATE 200 MG/ML IM SOLN: 200.0000 mg | INTRAMUSCULAR | 1 refills | Status: AC

## 2024-11-22 NOTE — Telephone Encounter (Signed)
 Done

## 2024-11-22 NOTE — Telephone Encounter (Signed)
 Copied from CRM [id number]. Topic: Clinical - Prescription Issue >> Nov 02, 2024 12:56 PM [name] wrote: Reason for CRM: Patient is out of blood pressure medicine and the pharmacy is not filling it because they state the directions on the bottle are wrong. I asked patient which blood pressure medicine and he said he is not sure. I asked if it's the amlodipine  and he said he thinks so.   They need the provider to update directions to take as needed, but patient is not sure completely because patient said he sometimes take an extra one if his blood pressure is still elevated. Patient doesnt have any pills today. He is completey out. He is not sure if a new complete order needs to be put in but [name] will not refill due to the directions on the script.   [facility] [id number] [name], [place] - [address] [name] [address] Phone: [phone] Fax: [phone] Hours: Not open 24 hours   [phone] (M)- Patient would like a call to know if the prescription would be sent in today, so he can pick them up.

## 2024-11-23 NOTE — Telephone Encounter (Signed)
 The Amlodipine -valsartan  is the only BP medication he takes. My instructions are correct (take one daily), but he has run out early because he is taking more than he should be. Please call his pharmacy to allow an early refill this time, but tell Darren Calhoun to never take more than one of these a day

## 2024-11-24 NOTE — Telephone Encounter (Signed)
 Spoke with pt pharmacy states that his Rx issues have been resolved and pt picked up refill.

## 2024-11-25 NOTE — Addendum Note (Signed)
 Addended by: LADONNA INOCENTE SAILOR on: 11/25/2024 04:45 PM   Modules accepted: Orders

## 2024-12-14 ENCOUNTER — Ambulatory Visit: Payer: Self-pay

## 2024-12-14 NOTE — Telephone Encounter (Signed)
First attempt- Left message

## 2024-12-14 NOTE — Telephone Encounter (Signed)
 FYI Only or Action Required?: FYI only for provider: appointment scheduled on 12/15/24.  Patient was last seen in primary care on 05/10/2024 by Johnny Garnette LABOR, MD.  Called Nurse Triage reporting Heart Problem.  Symptoms began 2 weeks ago.  Interventions attempted: Other: bought new BP monitor.  Symptoms are: unchanged.  Triage Disposition: Call PCP Within 24 Hours  Patient/caregiver understands and will follow disposition?:    Reason for Disposition  Heart rhythm alert (e.g., you have irregular heartbeat) from personal wearable device (e.g., Apple Watch)    BP machine  Answer Assessment - Initial Assessment Questions 1. DESCRIPTION: Please describe your heart rate or heartbeat that you are having (e.g., fast/slow, regular/irregular, skipped or extra beats, palpitations)     Patient has no symptoms, but noticed his BP machine signally irregular heartbeat for the last 2 weeks. Patient's dose of testosterone  was upped 2 weeks ago and he did not notice this until then. Patient bought a new BP machine to see if issue persists.   2. ONSET: When did it start? (e.g., minutes, hours, days)      2 weeks  3. DURATION: How long does it last (e.g., seconds, minutes, hours)     N/A  4. PATTERN Does it come and go, or has it been constant since it started?  Does it get worse with exertion?   Are you feeling it now?     N/A  5. TAP: Using your hand, can you tap out what you are feeling on a chair or table in front of you, so that I can hear? Note: Not all patients can do this.       Unable to assess  6. HEART RATE: Can you tell me your heart rate? How many beats in 15 seconds?  Note: Not all patients can do this.       Unable to assess  7. RECURRENT SYMPTOM: Have you ever had this before? If Yes, ask: When was the last time? and What happened that time?      Denies  8. CAUSE: What do you think is causing the palpitations?     Unsure, Testosterone  dosage upped 2  weeks ago  9. CARDIAC HISTORY: Do you have any history of heart disease? (e.g., heart attack, angina, bypass surgery, angioplasty, arrhythmia)      Denies  10. OTHER SYMPTOMS: Do you have any other symptoms? (e.g., dizziness, chest pain, sweating, difficulty breathing)       Denies  Protocols used: Heart Rate and Heartbeat Questions-A-AH   Message from [place] F sent at 12/14/2024  1:19 PM EST  Reason for Triage: Patient states since they up his testosterone  shot the irregular heartbeat light comes on his bp machine but says the reading is usually in the 70's or 80's. He doesn't feel weird or having any symptoms. States his wife tried the bp machine thinking maybe the machine is messing up but states it didn't do it to her. He is wondering if these testosterone  shots could have a side effect of irregular heart beat. He has had some dizziness but states he always has that due to allergies. Good call back number [phone].

## 2024-12-15 ENCOUNTER — Ambulatory Visit: Admitting: Family Medicine

## 2024-12-15 ENCOUNTER — Ambulatory Visit: Attending: Family Medicine

## 2024-12-15 ENCOUNTER — Ambulatory Visit: Payer: Self-pay | Admitting: Family Medicine

## 2024-12-15 ENCOUNTER — Encounter: Payer: Self-pay | Admitting: Family Medicine

## 2024-12-15 VITALS — BP 132/86 | HR 88 | Temp 98.0°F | Ht 70.0 in | Wt 226.0 lb

## 2024-12-15 DIAGNOSIS — I499 Cardiac arrhythmia, unspecified: Secondary | ICD-10-CM

## 2024-12-15 NOTE — Progress Notes (Unsigned)
 EP to read.

## 2024-12-15 NOTE — Progress Notes (Unsigned)
 "  Acute Office Visit   Subjective:  Patient ID: Darren Calhoun, male    DOB: November 04, 1971, 54 y.o.   MRN: [id number]  Chief Complaint  Patient presents with   Irregular Heart Beat    HPI Patient is here for an acute visit. Patients primary care provider is Dr. Garnette Olmsted.  Patient reports since increasing testosterone  around middle of December, he noticed irregular heart beat light on his blood pressure machine being on several times during his night time monitoring. He reports he changed machine with one indication  of irregular heart beat on new BP machine.   Also, concerned about his blood pressure. He is taking Amlodipine -Valsartan  5-160mg  tablet daily. He reports he has been monitoring his BP at home with BP mostly in 130s/80s. If stressed it may increase to 140s. He reports it has increased a little since decreasing medication.   Reports chronic dizziness. Denies palpitations, consistent chest pain, shortness of breath, lower extremity edema. Once in a while he will have left sided sharp chest pain, usually less than a second. Occurs when stressed or nervous. Usually fight or flight feeling.  ROS See HPI above      Objective:   BP 132/86   Pulse 88   Temp 98 F (36.7 C) (Oral)   Ht 5' 10 (1.778 m)   Wt 226 lb (102.5 kg)   SpO2 96%   BMI 32.43 kg/m  BP Readings from Last 3 Encounters:  12/15/24 132/86  07/20/24 (!) 137/90  05/10/24 110/70      Physical Exam Vitals reviewed.  Constitutional:      General: He is not in acute distress.    Appearance: Normal appearance. He is obese. He is not ill-appearing, toxic-appearing or diaphoretic.  HENT:     Head: Normocephalic and atraumatic.  Eyes:     General:        Right eye: No discharge.        Left eye: No discharge.     Conjunctiva/sclera: Conjunctivae normal.  Cardiovascular:     Rate and Rhythm: Normal rate and regular rhythm.     Heart sounds: Normal heart sounds. No murmur heard.    No friction rub. No  gallop.  Pulmonary:     Effort: Pulmonary effort is normal. No respiratory distress.     Breath sounds: Normal breath sounds.  Musculoskeletal:        General: Normal range of motion.     Right lower leg: No edema.     Left lower leg: No edema.  Skin:    General: Skin is warm and dry.  Neurological:     General: No focal deficit present.     Mental Status: He is alert and oriented to person, place, and time. Mental status is at baseline.  Psychiatric:        Mood and Affect: Mood is anxious.        Behavior: Behavior normal.        Thought Content: Thought content normal.        Judgment: Judgment normal.   EKG completed with concerns about irregular heartbeat. Sinus rhythm with no ST elevation, HR 87. Similar with no significant changes from last EKG on 10/14/2023.   Results for orders placed or performed in visit on 12/15/24  CBC with Differential/Platelet  Result Value Ref Range   WBC 7.7 4.0 - 10.5 K/uL   RBC 5.44 4.22 - 5.81 Mil/uL   Hemoglobin 16.4 13.0 - 17.0 g/dL  HCT 48.5 39.0 - 52.0 %   MCV 89.2 78.0 - 100.0 fl   MCHC 33.9 30.0 - 36.0 g/dL   RDW 86.2 88.4 - 84.4 %   Platelets 252.0 150.0 - 400.0 K/uL   Neutrophils Relative % 54.8 43.0 - 77.0 %   Lymphocytes Relative 25.4 12.0 - 46.0 %   Monocytes Relative 14.3 (H) 3.0 - 12.0 %   Eosinophils Relative 4.6 0.0 - 5.0 %   Basophils Relative 0.9 0.0 - 3.0 %   Neutro Abs 4.2 1.4 - 7.7 K/uL   Lymphs Abs 1.9 0.7 - 4.0 K/uL   Monocytes Absolute 1.1 (H) 0.1 - 1.0 K/uL   Eosinophils Absolute 0.3 0.0 - 0.7 K/uL   Basophils Absolute 0.1 0.0 - 0.1 K/uL  Comprehensive metabolic panel with GFR  Result Value Ref Range   Sodium 137 135 - 145 mEq/L   Potassium 3.9 3.5 - 5.1 mEq/L   Chloride 100 96 - 112 mEq/L   CO2 30 19 - 32 mEq/L   Glucose, Bld 122 (H) 70 - 99 mg/dL   BUN 21 6 - 23 mg/dL   Creatinine, Ser 8.89 0.40 - 1.50 mg/dL   Total Bilirubin 0.3 0.2 - 1.2 mg/dL   Alkaline Phosphatase 40 39 - 117 U/L   AST 22 5 - 37  U/L   ALT 32 3 - 53 U/L   Total Protein 7.3 6.0 - 8.3 g/dL   Albumin 4.4 3.5 - 5.2 g/dL   GFR 22.49 >39.99 mL/min   Calcium  9.6 8.4 - 10.5 mg/dL  TSH  Result Value Ref Range   TSH 3.31 0.35 - 5.50 uIU/mL  Magnesium  Result Value Ref Range   Magnesium 2.3 1.5 - 2.5 mg/dL        Assessment & Plan:  Irregular heartbeat -     CBC with Differential/Platelet -     Comprehensive metabolic panel with GFR -     TSH -     Magnesium -     EKG 12-Lead -     LONG TERM MONITOR (3-14 DAYS); Future  -Ordered labs for a reason of irregular heartbeat (CBC, CMP, TSH, and magnesium). Office will call with results and will be available via MyChart.  -EKG completed with acute findings or concerns.  -Ordered heart monitor to wear for 2 weeks to capture in any irregular heart rhythm. Discussed about the whole process of the monitoring system.  -Blood pressure is stable in office, recommend to monitor blood pressure twice a day for a few days and send in readings via MyChart. Will make a determination if medication needs to be changed.  -Follow up if symptoms become worse or does not improve.  *After visit, spoke with Dr. Johnny about patients condition and plan of care, which he agrees with.   Shanvi Moyd, NP "

## 2024-12-16 LAB — COMPREHENSIVE METABOLIC PANEL WITH GFR
ALT: 32 U/L (ref 3–53)
AST: 22 U/L (ref 5–37)
Albumin: 4.4 g/dL (ref 3.5–5.2)
Alkaline Phosphatase: 40 U/L (ref 39–117)
BUN: 21 mg/dL (ref 6–23)
CO2: 30 meq/L (ref 19–32)
Calcium: 9.6 mg/dL (ref 8.4–10.5)
Chloride: 100 meq/L (ref 96–112)
Creatinine, Ser: 1.1 mg/dL (ref 0.40–1.50)
GFR: 77.5 mL/min
Glucose, Bld: 122 mg/dL — ABNORMAL HIGH (ref 70–99)
Potassium: 3.9 meq/L (ref 3.5–5.1)
Sodium: 137 meq/L (ref 135–145)
Total Bilirubin: 0.3 mg/dL (ref 0.2–1.2)
Total Protein: 7.3 g/dL (ref 6.0–8.3)

## 2024-12-16 LAB — CBC WITH DIFFERENTIAL/PLATELET
Basophils Absolute: 0.1 K/uL (ref 0.0–0.1)
Basophils Relative: 0.9 % (ref 0.0–3.0)
Eosinophils Absolute: 0.3 K/uL (ref 0.0–0.7)
Eosinophils Relative: 4.6 % (ref 0.0–5.0)
HCT: 48.5 % (ref 39.0–52.0)
Hemoglobin: 16.4 g/dL (ref 13.0–17.0)
Lymphocytes Relative: 25.4 % (ref 12.0–46.0)
Lymphs Abs: 1.9 K/uL (ref 0.7–4.0)
MCHC: 33.9 g/dL (ref 30.0–36.0)
MCV: 89.2 fl (ref 78.0–100.0)
Monocytes Absolute: 1.1 K/uL — ABNORMAL HIGH (ref 0.1–1.0)
Monocytes Relative: 14.3 % — ABNORMAL HIGH (ref 3.0–12.0)
Neutro Abs: 4.2 K/uL (ref 1.4–7.7)
Neutrophils Relative %: 54.8 % (ref 43.0–77.0)
Platelets: 252 K/uL (ref 150.0–400.0)
RBC: 5.44 Mil/uL (ref 4.22–5.81)
RDW: 13.7 % (ref 11.5–15.5)
WBC: 7.7 K/uL (ref 4.0–10.5)

## 2024-12-16 LAB — TSH: TSH: 3.31 u[IU]/mL (ref 0.35–5.50)

## 2024-12-16 LAB — MAGNESIUM: Magnesium: 2.3 mg/dL (ref 1.5–2.5)

## 2024-12-20 NOTE — Patient Instructions (Addendum)
-  It was a pleasure to care for you. -Ordered labs for a reason of irregular heartbeat (CBC, CMP, TSH, and magnesium). Office will call with results and will be available via MyChart.  -EKG completed with acute findings or concerns.  -Ordered heart monitor to wear for 2 weeks to capture in any irregular heart rhythm. Discussed about the whole process of the monitoring system.  -Blood pressure is stable in office, recommend to monitor blood pressure twice a day for a few days and send in readings via MyChart. Will make a determination if medication needs to be changed.  -Follow up if symptoms become worse or does not improve.

## 2024-12-20 NOTE — Telephone Encounter (Signed)
 FYI

## 2024-12-20 NOTE — Telephone Encounter (Signed)
 These readings look great. Stay on the current medication dosage

## 2025-04-24 ENCOUNTER — Other Ambulatory Visit

## 2025-07-20 ENCOUNTER — Ambulatory Visit: Admitting: Urology
